# Patient Record
Sex: Female | Born: 1940 | Race: White | Hispanic: No | Marital: Married | Smoking: Never smoker
Health system: Southern US, Community
[De-identification: ages and names within clinical notes are randomized; demographics above are authoritative.]

## PROBLEM LIST (undated history)

## (undated) DIAGNOSIS — F0390 Unspecified dementia without behavioral disturbance: Secondary | ICD-10-CM

## (undated) DIAGNOSIS — R000 Tachycardia, unspecified: Secondary | ICD-10-CM

## (undated) DIAGNOSIS — K219 Gastro-esophageal reflux disease without esophagitis: Secondary | ICD-10-CM

## (undated) DIAGNOSIS — E785 Hyperlipidemia, unspecified: Secondary | ICD-10-CM

## (undated) HISTORY — DX: Hyperlipidemia, unspecified: E78.5

## (undated) HISTORY — DX: Tachycardia, unspecified: R00.0

## (undated) HISTORY — PX: CORONARY ANGIOPLASTY WITH STENT PLACEMENT: SHX49

## (undated) HISTORY — DX: Gastro-esophageal reflux disease without esophagitis: K21.9

---

## 2004-04-12 ENCOUNTER — Emergency Department (HOSPITAL_COMMUNITY): Admission: EM | Admit: 2004-04-12 | Discharge: 2004-04-12 | Payer: Self-pay | Admitting: *Deleted

## 2008-08-29 ENCOUNTER — Ambulatory Visit: Payer: Self-pay | Admitting: Cardiology

## 2008-09-03 ENCOUNTER — Ambulatory Visit (HOSPITAL_COMMUNITY): Admission: RE | Admit: 2008-09-03 | Discharge: 2008-09-03 | Payer: Self-pay | Admitting: Cardiology

## 2008-09-05 ENCOUNTER — Ambulatory Visit: Payer: Self-pay | Admitting: Cardiology

## 2008-09-05 ENCOUNTER — Inpatient Hospital Stay (HOSPITAL_BASED_OUTPATIENT_CLINIC_OR_DEPARTMENT_OTHER): Admission: RE | Admit: 2008-09-05 | Discharge: 2008-09-05 | Payer: Self-pay | Admitting: Cardiology

## 2008-09-13 ENCOUNTER — Inpatient Hospital Stay (HOSPITAL_COMMUNITY): Admission: RE | Admit: 2008-09-13 | Discharge: 2008-09-14 | Payer: Self-pay | Admitting: Cardiology

## 2008-09-13 ENCOUNTER — Ambulatory Visit: Payer: Self-pay | Admitting: Cardiology

## 2008-10-01 ENCOUNTER — Ambulatory Visit: Payer: Self-pay | Admitting: Cardiology

## 2008-12-31 DIAGNOSIS — R079 Chest pain, unspecified: Secondary | ICD-10-CM | POA: Insufficient documentation

## 2008-12-31 DIAGNOSIS — K219 Gastro-esophageal reflux disease without esophagitis: Secondary | ICD-10-CM | POA: Insufficient documentation

## 2008-12-31 DIAGNOSIS — E785 Hyperlipidemia, unspecified: Secondary | ICD-10-CM | POA: Insufficient documentation

## 2009-01-02 ENCOUNTER — Telehealth (INDEPENDENT_AMBULATORY_CARE_PROVIDER_SITE_OTHER): Payer: Self-pay | Admitting: *Deleted

## 2009-01-04 ENCOUNTER — Encounter (INDEPENDENT_AMBULATORY_CARE_PROVIDER_SITE_OTHER): Payer: Self-pay | Admitting: *Deleted

## 2009-01-07 ENCOUNTER — Encounter: Payer: Self-pay | Admitting: Cardiology

## 2009-01-07 DIAGNOSIS — I2510 Atherosclerotic heart disease of native coronary artery without angina pectoris: Secondary | ICD-10-CM | POA: Insufficient documentation

## 2009-01-10 ENCOUNTER — Ambulatory Visit: Payer: Self-pay | Admitting: Cardiology

## 2009-04-02 ENCOUNTER — Encounter (INDEPENDENT_AMBULATORY_CARE_PROVIDER_SITE_OTHER): Payer: Self-pay

## 2009-04-02 LAB — CONVERTED CEMR LAB
ALT: 16 units/L
AST: 18 units/L
Albumin: 4.5 g/dL
Alkaline Phosphatase: 78 units/L
CO2: 27 meq/L
Cholesterol: 235 mg/dL
Creatinine, Ser: 0.91 mg/dL
Glomerular Filtration Rate, Af Am: >60 mL/min/{1.73_m2}
HDL: 4.4 mg/dL
LDL Cholesterol: 139 mg/dL
Sodium: 144 meq/L
Total Protein: 7.2 g/dL

## 2009-04-04 ENCOUNTER — Encounter (INDEPENDENT_AMBULATORY_CARE_PROVIDER_SITE_OTHER): Payer: Self-pay

## 2009-04-08 ENCOUNTER — Ambulatory Visit: Payer: Self-pay | Admitting: Cardiology

## 2009-12-06 ENCOUNTER — Ambulatory Visit: Payer: Self-pay | Admitting: Cardiology

## 2010-03-25 ENCOUNTER — Encounter: Payer: Self-pay | Admitting: Cardiology

## 2010-07-01 NOTE — Assessment & Plan Note (Signed)
Summary: 6 MTH F/U PER CHECKOUT ON 04/08/09/TG   Visit Type:  Follow-up Referring Provider:  Dr. Catalina Pizza Primary Provider:  Dr. Catalina Pizza  CC:  no cardiology complaints.  History of Present Illness: Katrina Gallegos returns today for evaluation and management of her coronary disease. She has a history of a drug eluting stent placed in the right coronary artery in April 2004 for exertional angina.  She is having no angina has not had to use the nitroglycerin. Dr. Margo Aye switched her from simvastatin 82 Crestor 20.    Current Medications (verified): 1)  Klonopin 1 Mg Tabs (Clonazepam) .... Take 1 Tablet By Mouth Two Times A Day 2)  Metoprolol Succinate 50 Mg Xr24h-Tab (Metoprolol Succinate) .... Take One Tablet By Mouth Daily 3)  Plavix 75 Mg Tabs (Clopidogrel Bisulfate) .... Take One Tablet By Mouth Daily 4)  Aspirin 325 Mg Tabs (Aspirin) .... Take 1 Tab Daily 5)  Nitroglycerin 0.4 Mg Subl (Nitroglycerin) .... One Tablet Under Tongue Every 5 Minutes As Needed For Chest Pain---May Repeat Times Three 6)  Crestor 20 Mg Tabs (Rosuvastatin Calcium) .... Take 1 Tab At Bedtime 7)  Tums 500 Mg Chew (Calcium Carbonate Antacid) .... Use As Needed  Allergies (verified): No Known Drug Allergies  Past History:  Past Medical History: Last updated: 01/07/2009 Coronary artery disease: DES for 80-90% RCA lesion in 4/10; normal ejection fraction. SINUS TACHYCARDIA (ICD-427.89) DYSLIPIDEMIA (ICD-272.4) CHEST PAIN, EXERTIONAL (ICD-786.50) GERD (ICD-530.81)  Family History: Last updated: 01/27/2009 Father: deceased CVA Mother: deceased  MI Siblings: 1 sister living- cardiovascular disease                1 sister deceased unknown  Social History: Last updated: 01-27-2009 Married  Tobacco Use - No.  Alcohol Use - no Regular Exercise - no  Risk Factors: Exercise: no (27-Jan-2009)  Risk Factors: Smoking Status: never (Jan 27, 2009)  Review of Systems       negative other than history of  present illness  Vital Signs:  Patient profile:   70 year old female Weight:      146 pounds BMI:     27.69 Pulse rate:   66 / minute BP sitting:   125 / 69  (right arm)  Vitals Entered By: Dreama Saa, CNA (December 06, 2009 2:02 PM)  Physical Exam  General:  Well developed, well nourished, in no acute distress. Head:  normocephalic and atraumatic Eyes:  PERRLA/EOM intact; conjunctiva and lids normal. Neck:  Neck supple, no JVD. No masses, thyromegaly or abnormal cervical nodes. Lungs:  Clear bilaterally to auscultation and percussion. Heart:  Non-displaced PMI, chest non-tender; regular rate and rhythm, S1, S2 without murmurs, rubs or gallops. Carotid upstroke normal, no bruit. Normal abdominal aortic size, no bruits. Femorals normal pulses, no bruits. Pedals normal pulses. No edema, no varicosities. Msk:  Back normal, normal gait. Muscle strength and tone normal. Pulses:  pulses normal in all 4 extremities Extremities:  No clubbing or cyanosis. Neurologic:  Alert and oriented x 3. Skin:  Intact without lesions or rashes. Psych:  Normal affect.   Impression & Recommendations:  Problem # 1:  CAD (ICD-414.00) Assessment Unchanged  Her updated medication list for this problem includes:    Metoprolol Succinate 50 Mg Xr24h-tab (Metoprolol succinate) .Marland Kitchen... Take one tablet by mouth daily    Plavix 75 Mg Tabs (Clopidogrel bisulfate) .Marland Kitchen... Take one tablet by mouth daily    Aspirin 325 Mg Tabs (Aspirin) .Marland Kitchen... Take 1 tab daily    Nitroglycerin 0.4  Mg Subl (Nitroglycerin) ..... One tablet under tongue every 5 minutes as needed for chest pain---may repeat times three  Problem # 2:  DYSLIPIDEMIA (ICD-272.4) Assessment: Unchanged  Her updated medication list for this problem includes:    Crestor 20 Mg Tabs (Rosuvastatin calcium) .Marland Kitchen... Take 1 tab at bedtime  Problem # 3:  CHEST PAIN, EXERTIONAL (ICD-786.50) Assessment: Improved  Her updated medication list for this problem includes:     Metoprolol Succinate 50 Mg Xr24h-tab (Metoprolol succinate) .Marland Kitchen... Take one tablet by mouth daily    Plavix 75 Mg Tabs (Clopidogrel bisulfate) .Marland Kitchen... Take one tablet by mouth daily    Aspirin 325 Mg Tabs (Aspirin) .Marland Kitchen... Take 1 tab daily    Nitroglycerin 0.4 Mg Subl (Nitroglycerin) ..... One tablet under tongue every 5 minutes as needed for chest pain---may repeat times three  Patient Instructions: 1)  Your physician recommends that you schedule a follow-up appointment in: April 2012 2)  Your physician recommends that you continue on your current medications as directed. Please refer to the Current Medication list given to you today.  Prevention & Chronic Care Immunizations   Influenza vaccine: Not documented    Tetanus booster: Not documented    Pneumococcal vaccine: Not documented    H. zoster vaccine: Not documented  Colorectal Screening   Hemoccult: Not documented    Colonoscopy: Not documented  Other Screening   Pap smear: Not documented    Mammogram: Not documented    DXA bone density scan: Not documented   Smoking status: never  (12/31/2008)  Lipids   Total Cholesterol: 235  (04/02/2009)   LDL: 139  (04/02/2009)   LDL Direct: Not documented   HDL: 4.4  (04/02/2009)   Triglycerides: 212  (04/02/2009)    SGOT (AST): 18  (04/02/2009)   SGPT (ALT): 16  (04/02/2009)   Alkaline phosphatase: 78  (04/02/2009)   Total bilirubin: Not documented  Self-Management Support :    Lipid self-management support: Not documented

## 2010-09-10 LAB — BASIC METABOLIC PANEL
BUN: 9 mg/dL (ref 6–23)
CO2: 29 mEq/L (ref 19–32)
CO2: 30 mEq/L (ref 19–32)
Chloride: 104 mEq/L (ref 96–112)
Creatinine, Ser: 0.77 mg/dL (ref 0.4–1.2)
GFR calc non Af Amer: >60 mL/min (ref >60)
Glucose, Bld: 96 mg/dL (ref 70–99)
Glucose, Bld: 99 mg/dL (ref 70–99)
Potassium: 3.8 mEq/L (ref 3.5–5.1)
Potassium: 4.3 mEq/L (ref 3.5–5.1)
Sodium: 144 mEq/L (ref 135–145)

## 2010-09-10 LAB — APTT: aPTT: 28 seconds (ref 24–37)

## 2010-09-10 LAB — CBC
HCT: 34.7 % — ABNORMAL LOW (ref 36.0–46.0)
Hemoglobin: 12 g/dL (ref 12.0–15.0)
MCHC: 34.6 g/dL (ref 30.0–36.0)
RDW: 13.9 % (ref 11.5–15.5)

## 2010-10-03 ENCOUNTER — Encounter: Payer: Self-pay | Admitting: Cardiology

## 2010-10-03 ENCOUNTER — Ambulatory Visit (INDEPENDENT_AMBULATORY_CARE_PROVIDER_SITE_OTHER): Payer: Medicare Other | Admitting: Cardiology

## 2010-10-03 VITALS — BP 138/86 | HR 80 | Ht 61.0 in | Wt 151.0 lb

## 2010-10-03 DIAGNOSIS — I2510 Atherosclerotic heart disease of native coronary artery without angina pectoris: Secondary | ICD-10-CM

## 2010-10-03 NOTE — Assessment & Plan Note (Signed)
Stable, no change in treatment.

## 2010-10-03 NOTE — Progress Notes (Signed)
   Patient ID: Katrina Gallegos, female    DOB: [birth year], 70 y.o.   MRN: [id number]  HPI Katrina Gallegos returns today for E and M of her CAD. She is doing well without any complaint of angina or chest pain.  Primary Care follows her blood work. She is very compliant.    Review of Systems  All other systems reviewed and are negative.      Physical Exam  Constitutional: She appears well-nourished. No distress.  HENT:  Head: Normocephalic and atraumatic.  Eyes: EOM are normal. Pupils are equal, round, and reactive to light.  Neck: Neck supple. No JVD present. No tracheal deviation present. No thyromegaly present.  Cardiovascular: Normal rate, regular rhythm, normal heart sounds and intact distal pulses.   No murmur heard.      No carotid bruits  Pulmonary/Chest: Effort normal and breath sounds normal.  Abdominal: Soft. Bowel sounds are normal.  Musculoskeletal: She exhibits no edema.  Neurological: She is alert.  Skin: Skin is warm and dry.  Psychiatric: She has a normal mood and affect.

## 2010-10-03 NOTE — Patient Instructions (Signed)
**Note De-identified  Obfuscation** Your physician recommends that you continue on your current medications as directed. Please refer to the Current Medication list given to you today.  Your physician recommends that you schedule a follow-up appointment in: 1 year  

## 2010-10-14 NOTE — Assessment & Plan Note (Signed)
[facility]                       [place] CARDIOLOGY OFFICE NOTE   EDYNN, GILLOCK                        MRN:          [id number]  DATE:10/01/2008                            DOB:          1941-01-21    CARDIOLOGIST:  Jesse Sans. Daleen Squibb, MD, [facility]   PRIMARY CARE PHYSICIAN:  Catalina Pizza, MD   REASON FOR VISIT:  Post-hospitalization followup.   HISTORY OF PRESENT ILLNESS:  Katrina Gallegos is a 70 year old female patient  who recently saw Dr. Daleen Squibb in the office with chest pain concerning for  exertional angina pectoris.  She was set up for an outpatient cardiac  catheterization that demonstrated single vessel CAD with an 80-90%  ostial RCA stenosis.  She was brought back for intervention on September 13, 2008, with Dr. Juanda Chance.  She had a Xience drug-eluting stent placed to  the ostial RCA.  Her ejection fraction was well preserved.  She was  discharged from [facility] and follows up in the office today.  Unfortunately,  the patient stopped taking her aspirin for unclear reasons.  She does  continue on Plavix.  She has not had any recurrence of chest pain.  She  is complaining of difficulty with sleeping.  She seems to be under some  stress at home.  She denies any significant shortness of breath,  orthopnea, PND, or pedal edema.  She denies any syncope.  She has been  trying to walk on her treadmill.  She is doing well with that.   CURRENT MEDICATIONS:  1. Clonazepam 1 mg 3 times a day.  2. Simvastatin 40 mg nightly.  3. Ranitidine 150 mg daily.  4. Metoprolol succinate 50 mg daily.  5. Plavix 75 mg daily.  6. Tums p.r.n.  7. Aspirin p.r.n.   ALLERGIES:  No known drug allergies.   PHYSICAL EXAMINATION:  GENERAL:  She is a well-nourished, well-developed  female in no acute distress.  VITAL SIGNS:  Blood pressure 131/72, pulse 67, weight 131 pounds.  HEENT:  Normal neck without JVD.  CARDIAC:  Normal S1 and S2.  Regular rate and rhythm.  LUNGS:  Clear to  auscultation bilaterally.  ABDOMEN:  Soft, nontender.  EXTREMITIES:  Without edema.  NEUROLOGIC:  She is alert and oriented x3.  Cranial nerves II-XII  grossly intact.  VASCULAR:  Right femoral arteriotomy site without hematoma or bruit.   Electrocardiogram reveals sinus rhythm with a heart rate of 57, normal  axis, no acute changes.   ASSESSMENT AND PLAN:  1. Coronary disease status post Xience drug-eluting stent to the      ostial right coronary artery with overall preserved left      ventricular function.  She is doing well from a cardiovascular      standpoint.  She denies any further angina.  She is taking her      Plavix but unfortunately stopped her aspirin for unclear reasons.      I have explained to her the importance of taking aspirin and Plavix      together.  She should restart aspirin 325 mg a day today.  2.  Hypertension.  This is much better controlled on metoprolol      succinate 50 mg a day.  However, her pressure could be better.  She      is reporting a lot of fatigue that is probably related to insomnia.      We will hold off on any medication changes at this point until she      has her insomnia under control.  3. Insomnia.  I have asked her to start taking one of her clonazepam      at bedtime to see if this helps with sleep.  She sees Dr. Margo Aye in      several days.  If she receives no relief from this, she may be a      candidate for other therapies such as Ambien.  I will leave this up      to Dr. Margo Aye.  4. Dyslipidemia.  She had labs drawn by Dr. Margo Aye back in February that      demonstrated an LDL of 114.  I believe her simvastatin was      increased at that time.  Her goal LDL is less than or equal to 70      at this time.  She will continue followup with Dr. Margo Aye.  5. Dyspepsia.  The patient really denies any significant symptoms of      dyspepsia.  She was started on omeprazole initially when she began      having chest pain.  She was told to stop taking  omeprazole in the      hospital.  She has been given ranitidine to take on a p.r.n. basis.      I have explained to her that she can take this or TUMS as needed.   DISPOSITION:  She will be brought back in followup with Dr. Daleen Squibb in the  next 3 months or sooner p.r.n.      Tereso Newcomer, PA-C  Electronically Signed      Gerrit Friends. Dietrich Pates, MD, [facility]  Electronically Signed   SW/MedQ  DD: 10/01/2008  DT: 10/02/2008  Job #: [id number]   cc:   Catalina Pizza, M.D.

## 2010-10-14 NOTE — Discharge Summary (Signed)
Katrina Gallegos, Katrina Gallegos                 ACCOUNT NO.:  [id number]   MEDICAL RECORD NO.:  [id number]          PATIENT TYPE:  INP   LOCATION:  2507                         FACILITY:  [facility]   PHYSICIAN:  Everardo Beals. Juanda Chance, MD, FACCDATE OF BIRTH:  [birth year]   DATE OF ADMISSION:  09/13/2008  DATE OF DISCHARGE:  09/14/2008                               DISCHARGE SUMMARY   DISCHARGE DIAGNOSIS:  Angina/coronary artery disease status post cardiac  catheterization this admission by Dr. Charlies Constable on September 13, 2008.  The patient underwent successful percutaneous coronary intervention of  the ostial lesion in the right coronary artery using a Xience drug-  eluting stent with improvement in diameter narrowing from 95 to less  than 10%.   PAST MEDICAL HISTORY:  1. GERD.  2. Exertional chest discomfort.  3. Dyslipidemia.  4. Sinus tachycardia prompting increase in beta-blocker from 25 to 50      mg this admission.   HOSPITAL COURSE:  [name] is a 70 year old female followed by Dr.  Valera Castle in [place] and Dr. Catalina Pizza.  The patient was seen in  consultation by Dr. Daleen Squibb back in March for chest discomfort concerning  for angina.  The patient was scheduled for cardiac catheterization which  she underwent on April 7, was found to have an ejection fraction of 60%  and coronary artery disease with 80-90% stenosis in the ostium of the  RCA.  She had no significant obstruction in the LAD and circumflex  arteries.  The patient was scheduled for a planned PCI on April 15.  The  patient underwent procedure as stated above, tolerated without  complications.  The patient noted to remain mildly tachycardic by  cardiac rehabilitation staff with ambulating.  Note, the patient was  also anxious.  She had not been able to get in touch with her husband,  but her heart rate was 110-113 in room prior to walking and then  increased to 130 in hallway while walking.  Blood pressures were 140/95  and 123/87.   Thus the patient's beta-blocker was increased to 50 mg  daily.  The patient was seen by Dr. Juanda Chance on day of discharge.  Cath  site stable.  Potassium 3.8, creatinine 0.77, and hemoglobin 11.5.  The  patient stable to be discharged home.  She has already been started on  Plavix 75 mg daily.  Her Toprol was increased to 50 mg daily,  prescription provided.  Nitroglycerin p.r.n., prescription provided.  We  have instructed her to stop Prilosec and use Zantac 150 mg b.i.d.  She  can continue her  simvastatin 40 mg daily and aspirin 325.  She has been given the post  cardiac catheterization discharge instructions.  Follow up with Dr. Daleen Squibb  at the [facility] office on April 28 at 12 noon.   Duration of discharge encounter less than 30 minutes.      Dorian Pod, ACNP      Bruce R. Juanda Chance, MD, [facility]  Electronically Signed    MB/MEDQ  D:  09/14/2008  T:  09/15/2008  Job:  [id number]  cc:   Catalina Pizza, M.D.

## 2010-10-14 NOTE — Cardiovascular Report (Signed)
NAMEREEDA, SOOHOO                 ACCOUNT NO.:  [id number]   MEDICAL RECORD NO.:  [id number]          PATIENT TYPE:  INP   LOCATION:  2507                         FACILITY:  [facility]   PHYSICIAN:  Everardo Beals. Juanda Chance, MD, FACCDATE OF BIRTH:  1940-08-21   DATE OF PROCEDURE:  09/13/2008  DATE OF DISCHARGE:                            CARDIAC CATHETERIZATION   CLINICAL HISTORY:  Ms. Forton is 70 years old and recently developed  exertional chest pain.  She was sent by Dr. Margo Aye to Dr. Daleen Squibb who  arranged for her to have evaluation with angiography.  This was done  last week in the [facility] lab and she was found to have a 95% ostial stenosis  in the right coronary artery and we brought her back today for  intervention.   PROCEDURE:  The procedure was performed by right femoral artery and  arterial sheath and 6-French left bypass graft guiding catheter with  side holes.  The patient was given Angiomax bolus infusion and had been  previously loaded with Plavix and had been given 4 chewable aspirin.  We  passed a PT2 light-support wire across the ostial lesion without too  much difficulty.  We then predilated the lesion with a 2.25 x 15-mm apex  balloon performing several inflations up to 12 atmospheres for 30  seconds.  We then deployed a 2.75 x 12-mm Xience stent.  We positioned  this about 2 mm outside the ostium and deployed this with 1 inflation of  11 atmospheres of 30 seconds and second inflation of 16 atmospheres for  30 seconds with the balloon inside the distal edge.  We then postdilated  the lesion with a 3.25 x 12-mm Butler Voyager and deployed this with 1  inflation up to 18 atmospheres for 30 seconds.  Final diagnostics were  the performed through guiding catheter.  The patient tolerated the  procedure well and left laboratory in satisfactory condition.  Right  femoral artery was closed with Angio-Seal at the end of the procedure.   RESULTS:  Initially, stenosis in the ostium of the right  coronary artery  was estimated at 95%.  Following stenting, this improved to less than  10%.   CONCLUSION:  Successful PCI of the ostial lesion in the right coronary  using a Xience drug-eluting stent with improvement in diameter narrowing  from 95% to less than 10%.   DISPOSITION:  The patient returned to post angio unit for further  observation.      Bruce Elvera Lennox Juanda Chance, MD, [facility]  Electronically Signed     BRB/MEDQ  D:  09/13/2008  T:  09/14/2008  Job:  [id number]   cc:   Thomas C. Daleen Squibb, MD, [facility]  Catalina Pizza, M.D.  Cardiopulmonary Lab

## 2010-10-14 NOTE — Assessment & Plan Note (Signed)
[facility]                       [place] CARDIOLOGY OFFICE NOTE   ADDI, PAK                        MRN:          [id number]  DATE:08/29/2008                            DOB:          May 07, 1941    CHIEF COMPLAINT:  I think I have angina.   HISTORY OF PRESENT ILLNESS:  I was asked by Dr. Dwana Melena to consult on  [place] C. Bossier concerning chest tightness in the center of her chest  with exercise.   This has been going on for several months.  It is not associated with  any other symptoms other than some mild shortness of breath.  Specifically, no nausea, vomiting, diaphoresis.  Easily goes away after  she stops exercising.   Some of this is associated with bending over or after eating.  Some of  this sounds like reflux and actually some of the discomfort but not the  exertion related.  Symptoms gotten better with omeprazole.   Her cardiac risk factors include age, hyperlipidemia.  She also has a  strikingly positive family history particularly in her sisters, one who  died at age 48 of heart attack and one who is living with heart disease  at 77.  Her father also died of a stroke.  Her mother died of an MI.   PAST MEDICAL HISTORY:  She has had no surgeries other than a  colonoscopy.  She has no known drug allergies.  She does not smoke or  drink.   CURRENT MEDICATIONS:  1. B12 one p.o. daily.  2. Omeprazole 40 mg a day.  3. Clonazepam 1 mg p.o. t.i.d.  4. Simvastatin 40 mg at bedtime.  5. Tums p.r.n.  6. Aspirin 81 mg p.r.n. for headache.   SOCIAL HISTORY:  She lives in [place].  She is a Futures trader.  She is  married, has 1 child and 5 grandchildren.  She enjoys walking on a  treadmill.   FAMILY HISTORY:  As mentioned above.   REVIEW OF SYSTEMS:  She wears glasses.  She has dentures on the upper  side not on the lower.  She has had a history of palpitations in the  past which is currently not active.  She has a history of  gastroesophageal reflux as outlined above.  Her other review of systems  are negative.  All questioned on her diagnostic evaluation form.   PHYSICAL EXAMINATION:  VITAL SIGNS:  Her blood pressure is 142/84 in the  right arm, her pulse is 73 and regular.  She is 5 feet tall, weighs 140  pounds.  GENERAL:  She is very pleasant.  She is very anxious.  Alert and  oriented x3.  SKIN:  Warm and dry.  HEENT:  Normal otherwise other than the dentures.  NECK:  Supple.  Carotid upstrokes were equal bilaterally without bruits.  Thyroid is not enlarged.  Trachea is midline.  There is no  lymphadenopathy.  CHEST:  Lungs are clear to auscultation and percussion.  HEART:  Nondisplaced PMI, normal S1 and S2.  ABDOMEN:  Soft, good bowel sounds.  No midline bruit.  No  pulsatile  mass.  No hepatomegaly.  EXTREMITIES:  No cyanosis, clubbing, or edema.  Pulses were present and  equal bilaterally.  There is no sign of DVT.  SKIN:  Unremarkable.  MUSCULOSKELETAL:  Some chronic arthritic changes.   Her electrocardiogram is normal.   ASSESSMENT:  Exertional chest discomfort consistent with angina.  Some  of this may be reflux and some of it is particularly positional  discomfort, has gotten better with omeprazole.  She does have several  cardiac risk factors including age, very strong family history, and  hyperlipidemia.  However, note that her HDL was fairly high.   RECOMMENDATIONS:  1. Begin aspirin 81 mg p.o. daily.  2. Continue other medications.  3. Outpatient cardiac catheterization.  Indications, risks, potential      benefits have been discussed.  All necessary paperwork, consent      included, as well as a chest x-ray and precath blood work have been      ordered and will be reviewed.     Thomas C. Daleen Squibb, MD, [facility]  Electronically Signed    TCW/MedQ  DD: 08/29/2008  DT: 08/29/2008  Job #: [id number]   cc:   Catalina Pizza, M.D.

## 2010-10-14 NOTE — Cardiovascular Report (Signed)
Katrina Gallegos, Katrina Gallegos                 ACCOUNT NO.:  [id number]   MEDICAL RECORD NO.:  [id number]          PATIENT TYPE:  OIB   LOCATION:  1999                         FACILITY:  [facility]   PHYSICIAN:  Bruce R. Juanda Chance, MD, FACCDATE OF BIRTH:  [birth year]   DATE OF PROCEDURE:  09/05/2008  DATE OF DISCHARGE:  09/05/2008                            CARDIAC CATHETERIZATION   CLINICAL HISTORY:  Katrina Gallegos is 70 year old and has a strong family  history of coronary artery disease with a sister who died at 71 of a  heart attack and another sister who has coronary artery disease at age  42 and a mother who died of MI.  She recently has developed chest pain  with exertion and was seen in consultation by Dr. Daleen Squibb who recommended  evaluation with angiography.   PROCEDURE:  The procedure was performed by the right femoral arterial  sheath and 4-French preformed coronary catheters.  A front wall arterial  puncture with a following Omnipaque contrast was used.  We had  difficulty entering the right coronary artery and the 4-French catheter  damped.  We were able to get a subselective injection with the 4-French  catheter.  The patient tolerated the procedure well and left the  laboratory in satisfactory condition.   RESULTS:  Left main coronary artery.  The left main coronary artery is  free of significant disease.   Left anterior descending artery.  The left anterior descending artery  gave rise to three septal perforators and two diagonal branches.  These  and  __________ were free of significant disease.   The circumflex artery:  The circumflex artery gave rise to a ramus  branch, a marginal branch, and a small posterolateral branch.  These  vessels were free of significant disease.   The right coronary artery:  The right coronary artery was a moderate-  sized vessel that gave rise to posterior descending branch and three  posterolateral branches.  The vessel also gave rise to a right  ventricular  branch.  There was an 80-90% stenosis at the ostium of the  right coronary artery.  We could see some collateral filling from the  left coronary artery, which appeared to be right ventricular branches.   The left ventriculogram:  The left ventriculogram performed in the RAO  projection showed good wall motion with no areas of hypokinesis.  The  estimated ejection fraction was 60%.   The left ventricular pressure was 154/28 and __________ pressure was  154/86 with a mean of 116.   CONCLUSION:  Coronary artery disease with 80-90% stenosis in the ostium  of the right coronary artery, no significant obstruction in the LAD and  circumflex arteries and normal LV function.   RECOMMENDATIONS:  The patient has a tight what appears to be flow-  limiting lesion in the ostium of the right coronary artery.  We will  plan to schedule the patient for to return for percutaneous coronary  intervention on Tuesday April 13.  In the meantime, we will start Plavix  and continue aspirin and start the Toprol-XL 25 mg.  Bruce Elvera Lennox Juanda Chance, MD, [facility]  Electronically Signed     BRB/MEDQ  D:  09/05/2008  T:  09/06/2008  Job:  [id number]   cc:   Catalina Pizza, M.D.  Thomas C. Wall, MD, [facility]

## 2011-10-12 ENCOUNTER — Encounter: Payer: Self-pay | Admitting: Cardiology

## 2011-10-12 ENCOUNTER — Ambulatory Visit (INDEPENDENT_AMBULATORY_CARE_PROVIDER_SITE_OTHER): Payer: PRIVATE HEALTH INSURANCE | Admitting: Cardiology

## 2011-10-12 VITALS — BP 158/86 | HR 87 | Resp 16 | Ht 61.0 in | Wt 151.0 lb

## 2011-10-12 DIAGNOSIS — E785 Hyperlipidemia, unspecified: Secondary | ICD-10-CM

## 2011-10-12 DIAGNOSIS — I498 Other specified cardiac arrhythmias: Secondary | ICD-10-CM

## 2011-10-12 DIAGNOSIS — I2510 Atherosclerotic heart disease of native coronary artery without angina pectoris: Secondary | ICD-10-CM

## 2011-10-12 DIAGNOSIS — R000 Tachycardia, unspecified: Secondary | ICD-10-CM

## 2011-10-12 NOTE — Patient Instructions (Signed)
Your physician recommends that you continue on your current medications as directed. Please refer to the Current Medication list given to you today.  Your physician wants you to follow-up in: 1 year. You will receive a reminder letter in the mail two months in advance. If you don't receive a letter, please call our office to schedule the follow-up appointment.  

## 2011-10-12 NOTE — Progress Notes (Signed)
HPI Mr Burmaster comes in today for the evaluation and management of her history of chest pain, sinus tachycardia, and coronary artery disease.  She's having no symptoms of angina or ischemia. Prior arriving to the office, her husband and her were an argument. Her blood pressure was elevated in the 160 systolic. She has not had a history of hypertension.  Meds reviewed and she is compliant. She is on a statin, metoprolol, aspirin and nitroglycerin when necessary.  Past Medical History  Diagnosis Date  . Sinus tachycardia   . Dyslipidemia   . Chest pain   . GERD (gastroesophageal reflux disease)     Current Outpatient Prescriptions  Medication Sig Dispense Refill  . aspirin 81 MG tablet Take 81 mg by mouth as needed.       Marland Kitchen atorvastatin (LIPITOR) 20 MG tablet Take 20 mg by mouth daily.      . calcium carbonate (TUMS - DOSED IN MG ELEMENTAL CALCIUM) 500 MG chewable tablet Chew 1 tablet by mouth daily.        . clonazePAM (KLONOPIN) 1 MG tablet Take 1 mg by mouth 2 (two) times daily as needed.        . metoprolol (TOPROL-XL) 50 MG 24 hr tablet Take 50 mg by mouth daily.        . nitroGLYCERIN (NITROSTAT) 0.4 MG SL tablet Place 0.4 mg under the tongue every 5 (five) minutes as needed.          No Known Allergies  No family history on file.  History   Social History  . Marital Status: Married    Spouse Name: N/A    Number of Children: N/A  . Years of Education: N/A   Occupational History  . Not on file.   Social History Main Topics  . Smoking status: Never Smoker   . Smokeless tobacco: Never Used  . Alcohol Use: No  . Drug Use: No  . Sexually Active: Not on file   Other Topics Concern  . Not on file   Social History Narrative  . No narrative on file    ROS ALL NEGATIVE EXCEPT THOSE NOTED IN HPI  PE  General Appearance: well developed, well nourished in no acute distress, obese HEENT: symmetrical face, PERRLA, good dentition  Neck: no JVD, thyromegaly, or  adenopathy, trachea midline Chest: symmetric without deformity Cardiac: PMI non-displaced, RRR, normal S1, S2, no gallop or murmur Lung: clear to ausculation and percussion Vascular: all pulses full without bruits  Abdominal: nondistended, nontender, good bowel sounds, no HSM, no bruits Extremities: no cyanosis, clubbing or edema, no sign of DVT, no varicosities  Skin: normal color, no rashes Neuro: alert and oriented x 3, non-focal Pysch: normal affect I rechecked her blood pressure and it was 142/88. EKG Normal sinus rhythm, normal EKG BMET    Component Value Date/Time   NA 144 04/02/2009   K 4.8 04/02/2009   CL 106 04/02/2009   CO2 27 04/02/2009   GLUCOSE 88 04/02/2009   BUN 14 04/02/2009   CREATININE 0.91 04/02/2009   CALCIUM 9.8 04/02/2009   GFRNONAA >60 04/02/2009   GFRAA  Value: >60        The eGFR has been calculated using the MDRD equation. This calculation has not been validated in all clinical situations. eGFR's persistently <60 mL/min signify possible Chronic Kidney Disease. 09/14/2008 0630    Lipid Panel     Component Value Date/Time   CHOL 235 04/02/2009   TRIG 212 04/02/2009  HDL 4.4 04/02/2009   LDLCALC 139 04/02/2009    CBC    Component Value Date/Time   WBC 6.5 09/14/2008 0630   RBC 4.08 09/14/2008 0630   HGB 11.5* 09/14/2008 0630   HCT 33.2* 09/14/2008 0630   PLT 222 09/14/2008 0630   MCV 81.4 09/14/2008 0630   MCHC 34.6 09/14/2008 0630   RDW 14.1 09/14/2008 0630

## 2011-10-12 NOTE — Assessment & Plan Note (Signed)
Stable. Continue secondary preventative therapy. I've asked her to monitor her blood pressure at rest and are not upset. Goal of less than 140/90 given. Will see back in the office in a year.

## 2012-01-07 ENCOUNTER — Encounter: Payer: Self-pay | Admitting: Cardiology

## 2013-06-20 ENCOUNTER — Encounter: Payer: Self-pay | Admitting: Cardiology

## 2013-06-20 ENCOUNTER — Ambulatory Visit (INDEPENDENT_AMBULATORY_CARE_PROVIDER_SITE_OTHER): Payer: Private Health Insurance - Indemnity | Admitting: Cardiology

## 2013-06-20 VITALS — BP 119/62 | HR 88 | Ht 61.0 in | Wt 157.0 lb

## 2013-06-20 DIAGNOSIS — E785 Hyperlipidemia, unspecified: Secondary | ICD-10-CM

## 2013-06-20 DIAGNOSIS — I25810 Atherosclerosis of coronary artery bypass graft(s) without angina pectoris: Secondary | ICD-10-CM

## 2013-06-20 NOTE — Progress Notes (Addendum)
Clinical Summary Katrina Gallegos is a 73 y.o.female former patient of Dr Daleen Squibb, this is our first visit together. She is seen for the following medical problems.  1. CAD - prior DES to RCA in 08/2008, LVEF 60% by LV gram at that time - no recent chest pain. No SOB, no DOE. She walks on treadmill 15 minutes a day without issues - compliant with meds, she is not taking ASA, not sure she was supposed to.  2. Hyperlipidemia - followed by Dr Margo Aye, had recent labs last week.     Past Medical History  Diagnosis Date  . Sinus tachycardia   . Dyslipidemia   . Chest pain   . GERD (gastroesophageal reflux disease)      No Known Allergies   Current Outpatient Prescriptions  Medication Sig Dispense Refill  . aspirin 81 MG tablet Take 81 mg by mouth as needed.       Marland Kitchen atorvastatin (LIPITOR) 20 MG tablet Take 20 mg by mouth daily.      . calcium carbonate (TUMS - DOSED IN MG ELEMENTAL CALCIUM) 500 MG chewable tablet Chew 1 tablet by mouth daily.        . clonazePAM (KLONOPIN) 1 MG tablet Take 1 mg by mouth 2 (two) times daily as needed.        . metoprolol (TOPROL-XL) 50 MG 24 hr tablet Take 50 mg by mouth daily.        . nitroGLYCERIN (NITROSTAT) 0.4 MG SL tablet Place 0.4 mg under the tongue every 5 (five) minutes as needed.         No current facility-administered medications for this visit.     No past surgical history on file.   No Known Allergies    No family history on file.   Social History Katrina Gallegos reports that she has never smoked. She has never used smokeless tobacco. Katrina Gallegos reports that she does not drink alcohol.   Review of Systems CONSTITUTIONAL: No weight loss, fever, chills, weakness or fatigue.  HEENT: Eyes: No visual loss, blurred vision, double vision or yellow sclerae.No hearing loss, sneezing, congestion, runny nose or sore throat.  SKIN: No rash or itching.  CARDIOVASCULAR: per HPI RESPIRATORY: No shortness of breath, cough or sputum.    GASTROINTESTINAL: No anorexia, nausea, vomiting or diarrhea. No abdominal pain or blood.  GENITOURINARY: No burning on urination, no polyuria NEUROLOGICAL: No headache, dizziness, syncope, paralysis, ataxia, numbness or tingling in the extremities. No change in bowel or bladder control.  MUSCULOSKELETAL: No muscle, back pain, joint pain or stiffness.  LYMPHATICS: No enlarged nodes. No history of splenectomy.  PSYCHIATRIC: No history of depression or anxiety.  ENDOCRINOLOGIC: No reports of sweating, cold or heat intolerance. No polyuria or polydipsia.  Marland Kitchen   Physical Examination p 88 bp 119/62 Wt 157 lbs BMI 30 Gen: resting comfortably, no acute distress HEENT: no scleral icterus, pupils equal round and reactive, no palptable cervical adenopathy,  CV: RRR, no m/r/g, no JVD, no carotid bruits Resp: Clear to auscultation bilaterally GI: abdomen is soft, non-tender, non-distended, normal bowel sounds, no hepatosplenomegaly MSK: extremities are warm, no edema.  Skin: warm, no rash Neuro:  no focal deficits Psych: appropriate affect   Diagnostic Studies 08/2008 Cath  RESULTS: Left main coronary artery. The left main coronary artery is  free of significant disease.  Left anterior descending artery. The left anterior descending artery  gave rise to three septal perforators and two diagonal branches. These  and __________  were free of significant disease.  The circumflex artery: The circumflex artery gave rise to a ramus  branch, a marginal branch, and a small posterolateral branch. These  vessels were free of significant disease.  The right coronary artery: The right coronary artery was a moderate-  sized vessel that gave rise to posterior descending branch and three  posterolateral branches. The vessel also gave rise to a right  ventricular branch. There was an 80-90% stenosis at the ostium of the  right coronary artery. We could see some collateral filling from the  left coronary  artery, which appeared to be right ventricular branches.  The left ventriculogram: The left ventriculogram performed in the RAO  projection showed good wall motion with no areas of hypokinesis. The  estimated ejection fraction was 60%.  The left ventricular pressure was 154/28 and __________ pressure was  154/86 with a mean of 116.  CONCLUSION: Coronary artery disease with 80-90% stenosis in the ostium  of the right coronary artery, no significant obstruction in the LAD and  circumflex arteries and normal LV function.  RECOMMENDATIONS: The patient has a tight what appears to be flow-  limiting lesion in the ostium of the right coronary artery. We will  plan to schedule the patient for to return for percutaneous coronary  intervention on Tuesday April 13. In the meantime, we will start Plavix  and continue aspirin and start the Toprol-XL 25 mg.   RESULTS: Initially, stenosis in the ostium of the right coronary artery  was estimated at 95%. Following stenting, this improved to less than  10%.  CONCLUSION: Successful PCI of the ostial lesion in the right coronary  using a Xience drug-eluting stent with improvement in diameter narrowing  from 95% to less than 10%.    06/20/13 Clinic EKG Normal sinus rhythm   Assessment and Plan  1. CAD - no current symptoms - continue risk factor modification and secondary prevention. Start ASA 81mg  daily.  2. Hyperlipidemia - will request most recent lipids from Dr Margo AyeHall, continue current statin   Follow up 1 year      Antoine PocheJonathan F. Branch, M.D., F.A.C.C.

## 2013-06-20 NOTE — Patient Instructions (Signed)
Your physician wants you to follow-up in: ONE YEAR You will receive a reminder letter in the mail two months in advance. If you don't receive a letter, please call our office to schedule the follow-up appointment.  Your physician has recommended you make the following change in your medication:   1) START TAKING ASPIRIN 81MG ONCE DAILY 

## 2013-06-23 ENCOUNTER — Encounter: Payer: Self-pay | Admitting: Cardiology

## 2014-06-21 ENCOUNTER — Ambulatory Visit: Payer: Private Health Insurance - Indemnity | Admitting: Cardiology

## 2014-09-07 ENCOUNTER — Ambulatory Visit (INDEPENDENT_AMBULATORY_CARE_PROVIDER_SITE_OTHER): Payer: Private Health Insurance - Indemnity | Admitting: Cardiology

## 2014-09-07 ENCOUNTER — Encounter: Payer: Self-pay | Admitting: Cardiology

## 2014-09-07 VITALS — BP 126/82 | HR 102 | Ht 61.0 in | Wt 155.0 lb

## 2014-09-07 DIAGNOSIS — I25709 Atherosclerosis of coronary artery bypass graft(s), unspecified, with unspecified angina pectoris: Secondary | ICD-10-CM

## 2014-09-07 DIAGNOSIS — E785 Hyperlipidemia, unspecified: Secondary | ICD-10-CM | POA: Diagnosis not present

## 2014-09-07 DIAGNOSIS — I2510 Atherosclerotic heart disease of native coronary artery without angina pectoris: Secondary | ICD-10-CM | POA: Diagnosis not present

## 2014-09-07 MED ORDER — ATORVASTATIN CALCIUM 80 MG PO TABS: 80.0000 mg | ORAL_TABLET | Freq: Every day | ORAL | Status: DC

## 2014-09-07 MED ORDER — LISINOPRIL 2.5 MG PO TABS: 2.5000 mg | ORAL_TABLET | Freq: Every day | ORAL | Status: DC

## 2014-09-07 NOTE — Progress Notes (Signed)
Clinical Summary Ms. Katrina Gallegos is a 74 y.o.female seen today for follow up of the following medical problems.   1. CAD - prior DES to RCA in 08/2008, LVEF 60% by LV gram at that time - denies any chest pain, no SOB - compliant with meds  2. Hyperlipidemia - no recent panel in our system  Past Medical History  Diagnosis Date  . Sinus tachycardia   . Dyslipidemia   . Chest pain   . GERD (gastroesophageal reflux disease)      No Known Allergies   Current Outpatient Prescriptions  Medication Sig Dispense Refill  . atorvastatin (LIPITOR) 20 MG tablet Take 40 mg by mouth daily.     . calcium carbonate (TUMS - DOSED IN MG ELEMENTAL CALCIUM) 500 MG chewable tablet Chew 1 tablet by mouth daily.      Marland Kitchen. ezetimibe (ZETIA) 10 MG tablet Take 10 mg by mouth daily.    . fenofibrate (TRICOR) 145 MG tablet Take 145 mg by mouth daily.    . metoprolol (TOPROL-XL) 50 MG 24 hr tablet Take 50 mg by mouth daily.      . nitroGLYCERIN (NITROSTAT) 0.4 MG SL tablet Place 0.4 mg under the tongue every 5 (five) minutes as needed.       No current facility-administered medications for this visit.     No past surgical history on file.   No Known Allergies    No family history on file.   Social History Ms. Shimamoto reports that she has never smoked. She has never used smokeless tobacco. Ms. Katrina Gallegos reports that she does not drink alcohol.   Review of Systems CONSTITUTIONAL: No weight loss, fever, chills, weakness or fatigue.  HEENT: Eyes: No visual loss, blurred vision, double vision or yellow sclerae.No hearing loss, sneezing, congestion, runny nose or sore throat.  SKIN: No rash or itching.  CARDIOVASCULAR: per HPI RESPIRATORY: No shortness of breath, cough or sputum.  GASTROINTESTINAL: No anorexia, nausea, vomiting or diarrhea. No abdominal pain or blood.  GENITOURINARY: No burning on urination, no polyuria NEUROLOGICAL: No headache, dizziness, syncope, paralysis, ataxia, numbness or  tingling in the extremities. No change in bowel or bladder control.  MUSCULOSKELETAL: No muscle, back pain, joint pain or stiffness.  LYMPHATICS: No enlarged nodes. No history of splenectomy.  PSYCHIATRIC: No history of depression or anxiety.  ENDOCRINOLOGIC: No reports of sweating, cold or heat intolerance. No polyuria or polydipsia.  Marland Kitchen.   Physical Examination p 91 bp 126/82 Wt 155 lbs BMI 29 Gen: resting comfortably, no acute distress HEENT: no scleral icterus, pupils equal round and reactive, no palptable cervical adenopathy,  CV: RRR, no m/r/g, no JVD, no carotid bruits Resp: Clear to auscultation bilaterally GI: abdomen is soft, non-tender, non-distended, normal bowel sounds, no hepatosplenomegaly MSK: extremities are warm, no edema.  Skin: warm, no rash Neuro:  no focal deficits Psych: appropriate affect   Diagnostic Studies  08/2008 Cath  RESULTS: Left main coronary artery. The left main coronary artery is  free of significant disease.  Left anterior descending artery. The left anterior descending artery  gave rise to three septal perforators and two diagonal branches. These  and __________ were free of significant disease.  The circumflex artery: The circumflex artery gave rise to a ramus  Katrina Gallegos, a marginal Katrina Gallegos, and a small posterolateral Katrina Gallegos. These  vessels were free of significant disease.  The right coronary artery: The right coronary artery was a moderate-  sized vessel that gave rise to posterior descending  Katrina Gallegos and three  posterolateral branches. The vessel also gave rise to a right  ventricular Katrina Gallegos. There was an 80-90% stenosis at the ostium of the  right coronary artery. We could see some collateral filling from the  left coronary artery, which appeared to be right ventricular branches.  The left ventriculogram: The left ventriculogram performed in the RAO  projection showed good wall motion with no areas of hypokinesis. The  estimated ejection  fraction was 60%.  The left ventricular pressure was 154/28 and __________ pressure was  154/86 with a mean of 116.  CONCLUSION: Coronary artery disease with 80-90% stenosis in the ostium  of the right coronary artery, no significant obstruction in the LAD and  circumflex arteries and normal LV function.  RECOMMENDATIONS: The patient has a tight what appears to be flow-  limiting lesion in the ostium of the right coronary artery. We will  plan to schedule the patient for to return for percutaneous coronary  intervention on Tuesday April 13. In the meantime, we will start Plavix  and continue aspirin and start the Toprol-XL 25 mg.  RESULTS: Initially, stenosis in the ostium of the right coronary artery  was estimated at 95%. Following stenting, this improved to less than  10%.  CONCLUSION: Successful PCI of the ostial lesion in the right coronary  using a Xience drug-eluting stent with improvement in diameter narrowing  from 95% to less than 10%.     Assessment and Plan  1. CAD - no current symptoms - continue risk factor modification and secondary prevention.  - start low dose lisinopril 2.5mg  daily for cardiovacular outcome benefits. Check BMET in 2 weeks.  2. Hyperlipidemia - stop zetia, increase atorvastatin to  daily in setting of known CAD.    F/u 1 year   Antoine Poche, M.D.

## 2014-09-07 NOTE — Patient Instructions (Signed)
Your physician wants you to follow-up in: 1 year with Dr Lurena JoinerBranch You will receive a reminder letter in the mail two months in advance. If you don't receive a letter, please call our office to schedule the follow-up appointment.    STOP Zetia   INCREASE Lipitor to 80 mg daily  START Lisinopril to 2.5 mg daily   Please get lab work in 2 weeks BMET    Thank you for choosing  [facility] !

## 2014-09-25 LAB — BASIC METABOLIC PANEL
BUN: 14 mg/dL (ref 6–23)
CO2: 25 meq/L (ref 19–32)
Calcium: 10.1 mg/dL (ref 8.4–10.5)
Chloride: 104 mEq/L (ref 96–112)
Creat: 1.01 mg/dL (ref 0.50–1.10)
Glucose, Bld: 90 mg/dL (ref 70–99)
Potassium: 4.8 mEq/L (ref 3.5–5.3)
SODIUM: 141 meq/L (ref 135–145)

## 2015-03-20 ENCOUNTER — Ambulatory Visit (HOSPITAL_COMMUNITY)
Admission: RE | Admit: 2015-03-20 | Discharge: 2015-03-20 | Disposition: A | Discharge status: Home / Self Care | Payer: Medicare HMO | Source: Ambulatory Visit | Attending: Internal Medicine | Admitting: Internal Medicine

## 2015-03-20 ENCOUNTER — Other Ambulatory Visit (HOSPITAL_COMMUNITY): Payer: Self-pay | Admitting: Internal Medicine

## 2015-03-20 DIAGNOSIS — M79642 Pain in left hand: Secondary | ICD-10-CM | POA: Insufficient documentation

## 2015-03-20 DIAGNOSIS — M19042 Primary osteoarthritis, left hand: Secondary | ICD-10-CM | POA: Diagnosis not present

## 2015-03-20 DIAGNOSIS — M25532 Pain in left wrist: Secondary | ICD-10-CM | POA: Insufficient documentation

## 2015-03-20 DIAGNOSIS — M1990 Unspecified osteoarthritis, unspecified site: Secondary | ICD-10-CM

## 2015-03-20 DIAGNOSIS — M19032 Primary osteoarthritis, left wrist: Secondary | ICD-10-CM | POA: Diagnosis not present

## 2015-03-20 DIAGNOSIS — M1812 Unilateral primary osteoarthritis of first carpometacarpal joint, left hand: Secondary | ICD-10-CM | POA: Diagnosis not present

## 2015-05-01 DIAGNOSIS — I10 Essential (primary) hypertension: Secondary | ICD-10-CM | POA: Diagnosis not present

## 2015-05-01 DIAGNOSIS — E782 Mixed hyperlipidemia: Secondary | ICD-10-CM | POA: Diagnosis not present

## 2015-05-01 DIAGNOSIS — R7301 Impaired fasting glucose: Secondary | ICD-10-CM | POA: Diagnosis not present

## 2015-05-03 DIAGNOSIS — R7301 Impaired fasting glucose: Secondary | ICD-10-CM | POA: Diagnosis not present

## 2015-05-03 DIAGNOSIS — M180 Bilateral primary osteoarthritis of first carpometacarpal joints: Secondary | ICD-10-CM | POA: Diagnosis not present

## 2015-05-03 DIAGNOSIS — I2510 Atherosclerotic heart disease of native coronary artery without angina pectoris: Secondary | ICD-10-CM | POA: Diagnosis not present

## 2015-05-03 DIAGNOSIS — E782 Mixed hyperlipidemia: Secondary | ICD-10-CM | POA: Diagnosis not present

## 2015-09-16 DIAGNOSIS — R7301 Impaired fasting glucose: Secondary | ICD-10-CM | POA: Diagnosis not present

## 2015-09-16 DIAGNOSIS — E782 Mixed hyperlipidemia: Secondary | ICD-10-CM | POA: Diagnosis not present

## 2015-09-17 DIAGNOSIS — I10 Essential (primary) hypertension: Secondary | ICD-10-CM | POA: Diagnosis not present

## 2015-09-17 DIAGNOSIS — I2510 Atherosclerotic heart disease of native coronary artery without angina pectoris: Secondary | ICD-10-CM | POA: Diagnosis not present

## 2015-09-17 DIAGNOSIS — R7301 Impaired fasting glucose: Secondary | ICD-10-CM | POA: Diagnosis not present

## 2015-10-01 ENCOUNTER — Other Ambulatory Visit: Payer: Self-pay | Admitting: Cardiology

## 2015-10-15 ENCOUNTER — Encounter: Payer: Self-pay | Admitting: Cardiology

## 2015-10-15 ENCOUNTER — Ambulatory Visit (INDEPENDENT_AMBULATORY_CARE_PROVIDER_SITE_OTHER): Payer: Medicare HMO | Admitting: Cardiology

## 2015-10-15 VITALS — BP 126/72 | HR 111 | Ht 61.0 in | Wt 158.0 lb

## 2015-10-15 DIAGNOSIS — E785 Hyperlipidemia, unspecified: Secondary | ICD-10-CM | POA: Diagnosis not present

## 2015-10-15 DIAGNOSIS — I25810 Atherosclerosis of coronary artery bypass graft(s) without angina pectoris: Secondary | ICD-10-CM | POA: Diagnosis not present

## 2015-10-15 DIAGNOSIS — R2681 Unsteadiness on feet: Secondary | ICD-10-CM | POA: Diagnosis not present

## 2015-10-15 MED ORDER — NITROGLYCERIN 0.4 MG SL SUBL: 0.4000 mg | SUBLINGUAL_TABLET | SUBLINGUAL | Status: DC | PRN

## 2015-10-15 NOTE — Progress Notes (Signed)
Patient ID: Katrina Gallegos, female   DOB: 05/28/1941, 75 y.o.   MRN: [id number]     Clinical Summary Ms. Katrina Gallegos is a 75 y.o.female seen today for follow up of the following medical problems.   1. CAD - prior DES to RCA in 08/2008, LVEF 60% by LV gram at that time  - denies any chest pain. No SOB or DOE. - she is compliant withher meds.   2. Hyperlipidemia - no recent panel in our system - reports recent labs with Dr Margo AyeHall - currently on statin and fenofibrate   Past Medical History  Diagnosis Date  . Sinus tachycardia   . Dyslipidemia   . Chest pain   . GERD (gastroesophageal reflux disease)      No Known Allergies   Current Outpatient Prescriptions  Medication Sig Dispense Refill  . aspirin EC 81 MG tablet Take 81 mg by mouth daily.    Marland Kitchen. atorvastatin (LIPITOR) 80 MG tablet TAKE 1 TABLET BY MOUTH DAILY. 90 tablet 3  . calcium carbonate (TUMS - DOSED IN MG ELEMENTAL CALCIUM) 500 MG chewable tablet Chew 1 tablet by mouth daily.      . fenofibrate (TRICOR) 145 MG tablet Take 145 mg by mouth daily.    Marland Kitchen. lisinopril (PRINIVIL,ZESTRIL) 2.5 MG tablet Take 1 tablet (2.5 mg total) by mouth daily. 90 tablet 3  . metoprolol (TOPROL-XL) 50 MG 24 hr tablet Take 50 mg by mouth daily.      . nitroGLYCERIN (NITROSTAT) 0.4 MG SL tablet Place 0.4 mg under the tongue every 5 (five) minutes as needed.       No current facility-administered medications for this visit.     No past surgical history on file.   No Known Allergies    No family history on file.   Social History [name] reports that she has never smoked. She has never used smokeless tobacco. Ms. Katrina Gallegos reports that she does not drink alcohol.   Review of Systems CONSTITUTIONAL: No weight loss, fever, chills, weakness or fatigue.  HEENT: Eyes: No visual loss, blurred vision, double vision or yellow sclerae.No hearing loss, sneezing, congestion, runny nose or sore throat.  SKIN: No rash or itching.  CARDIOVASCULAR:    RESPIRATORY: No shortness of breath, cough or sputum.  GASTROINTESTINAL: No anorexia, nausea, vomiting or diarrhea. No abdominal pain or blood.  GENITOURINARY: No burning on urination, no polyuria NEUROLOGICAL: No headache, dizziness, syncope, paralysis, ataxia, numbness or tingling in the extremities. No change in bowel or bladder control.  MUSCULOSKELETAL: No muscle, back pain, joint pain or stiffness.  LYMPHATICS: No enlarged nodes. No history of splenectomy.  PSYCHIATRIC: No history of depression or anxiety.  ENDOCRINOLOGIC: No reports of sweating, cold or heat intolerance. No polyuria or polydipsia.  Marland Kitchen.   Physical Examination There were no vitals filed for this visit. There were no vitals filed for this visit.  Gen: resting comfortably, no acute distress HEENT: no scleral icterus, pupils equal round and reactive, no palptable cervical adenopathy,  CV Resp: Clear to auscultation bilaterally GI: abdomen is soft, non-tender, non-distended, normal bowel sounds, no hepatosplenomegaly MSK: extremities are warm, no edema.  Skin: warm, no rash Neuro:  no focal deficits Psych: appropriate affect   Diagnostic Studies 08/2008 Cath  RESULTS: Left main coronary artery. The left main coronary artery is  free of significant disease.  Left anterior descending artery. The left anterior descending artery  gave rise to three septal perforators and two diagonal branches. These  and __________ were  free of significant disease.  The circumflex artery: The circumflex artery gave rise to a ramus  branch, a marginal branch, and a small posterolateral branch. These  vessels were free of significant disease.  The right coronary artery: The right coronary artery was a moderate-  sized vessel that gave rise to posterior descending branch and three  posterolateral branches. The vessel also gave rise to a right  ventricular branch. There was an 80-90% stenosis at the ostium of the  right  coronary artery. We could see some collateral filling from the  left coronary artery, which appeared to be right ventricular branches.  The left ventriculogram: The left ventriculogram performed in the RAO  projection showed good wall motion with no areas of hypokinesis. The  estimated ejection fraction was 60%.  The left ventricular pressure was 154/28 and __________ pressure was  154/86 with a mean of 116.  CONCLUSION: Coronary artery disease with 80-90% stenosis in the ostium  of the right coronary artery, no significant obstruction in the LAD and  circumflex arteries and normal LV function.  RECOMMENDATIONS: The patient has a tight what appears to be flow-  limiting lesion in the ostium of the right coronary artery. We will  plan to schedule the patient for to return for percutaneous coronary  intervention on Tuesday April 13. In the meantime, we will start Plavix  and continue aspirin and start the Toprol-XL 25 mg.  RESULTS: Initially, stenosis in the ostium of the right coronary artery  was estimated at 95%. Following stenting, this improved to less than  10%.  CONCLUSION: Successful PCI of the ostial lesion in the right coronary  using a Xience drug-eluting stent with improvement in diameter narrowing  from 95% to less than 10%.     Assessment and Plan   1. CAD - no current symptoms. EKG in clinic NSR without ischemic changes - continue current meds  2. Hyperlipidemia - stop fenofibrate due to lack of clinical benefits, continue atorvastati - request labs from pcp   3. Gait instability - will refer to PT for evaluation     Antoine Poche, M.D.

## 2015-10-15 NOTE — Patient Instructions (Signed)
Medication Instructions:  Stop fenofibrate   Labwork: I  am requesting lab work from your pcp  Testing/Procedures: NONE  Follow-Up: Your physician wants you to follow-up in: 1 YEAR.  You will receive a reminder letter in the mail two months in advance. If you don't receive a letter, please call our office to schedule the follow-up appointment.   Any Other Special Instructions Will Be Listed Below (If Applicable).  I HAVE PUT IN A REFERRAL FOR PHYSICAL THERAPY AND SOMEONE WILL CONTACT YOU SOON.    If you need a refill on your cardiac medications before your next appointment, please call your pharmacy.

## 2015-12-31 DIAGNOSIS — E782 Mixed hyperlipidemia: Secondary | ICD-10-CM | POA: Diagnosis not present

## 2015-12-31 DIAGNOSIS — R7301 Impaired fasting glucose: Secondary | ICD-10-CM | POA: Diagnosis not present

## 2016-01-07 DIAGNOSIS — R7301 Impaired fasting glucose: Secondary | ICD-10-CM | POA: Diagnosis not present

## 2016-01-07 DIAGNOSIS — M1812 Unilateral primary osteoarthritis of first carpometacarpal joint, left hand: Secondary | ICD-10-CM | POA: Diagnosis not present

## 2016-01-07 DIAGNOSIS — I2510 Atherosclerotic heart disease of native coronary artery without angina pectoris: Secondary | ICD-10-CM | POA: Diagnosis not present

## 2016-01-07 DIAGNOSIS — E782 Mixed hyperlipidemia: Secondary | ICD-10-CM | POA: Diagnosis not present

## 2016-07-23 DIAGNOSIS — K08409 Partial loss of teeth, unspecified cause, unspecified class: Secondary | ICD-10-CM | POA: Diagnosis not present

## 2016-07-23 DIAGNOSIS — I10 Essential (primary) hypertension: Secondary | ICD-10-CM | POA: Diagnosis not present

## 2016-07-23 DIAGNOSIS — Z0000 Encounter for general adult medical examination without abnormal findings: Secondary | ICD-10-CM | POA: Diagnosis not present

## 2016-07-23 DIAGNOSIS — E785 Hyperlipidemia, unspecified: Secondary | ICD-10-CM | POA: Diagnosis not present

## 2016-07-23 DIAGNOSIS — Z7982 Long term (current) use of aspirin: Secondary | ICD-10-CM | POA: Diagnosis not present

## 2016-07-23 DIAGNOSIS — Z6830 Body mass index (BMI) 30.0-30.9, adult: Secondary | ICD-10-CM | POA: Diagnosis not present

## 2016-07-23 DIAGNOSIS — E669 Obesity, unspecified: Secondary | ICD-10-CM | POA: Diagnosis not present

## 2016-11-12 DIAGNOSIS — Z961 Presence of intraocular lens: Secondary | ICD-10-CM | POA: Diagnosis not present

## 2017-01-02 DIAGNOSIS — R4182 Altered mental status, unspecified: Secondary | ICD-10-CM | POA: Diagnosis not present

## 2017-01-02 DIAGNOSIS — G4700 Insomnia, unspecified: Secondary | ICD-10-CM | POA: Diagnosis not present

## 2017-01-02 DIAGNOSIS — R300 Dysuria: Secondary | ICD-10-CM | POA: Diagnosis not present

## 2017-01-02 DIAGNOSIS — M7981 Nontraumatic hematoma of soft tissue: Secondary | ICD-10-CM | POA: Diagnosis not present

## 2017-01-02 DIAGNOSIS — Z9181 History of falling: Secondary | ICD-10-CM | POA: Diagnosis not present

## 2017-01-02 DIAGNOSIS — Z7689 Persons encountering health services in other specified circumstances: Secondary | ICD-10-CM | POA: Diagnosis not present

## 2017-01-02 DIAGNOSIS — R000 Tachycardia, unspecified: Secondary | ICD-10-CM | POA: Diagnosis not present

## 2017-01-02 DIAGNOSIS — R69 Illness, unspecified: Secondary | ICD-10-CM | POA: Diagnosis not present

## 2017-01-12 ENCOUNTER — Other Ambulatory Visit (HOSPITAL_COMMUNITY): Payer: Self-pay | Admitting: Pulmonary Disease

## 2017-01-12 DIAGNOSIS — R4182 Altered mental status, unspecified: Secondary | ICD-10-CM

## 2017-01-20 ENCOUNTER — Other Ambulatory Visit (HOSPITAL_COMMUNITY): Payer: Self-pay | Admitting: Internal Medicine

## 2017-01-20 ENCOUNTER — Ambulatory Visit (HOSPITAL_COMMUNITY)
Admission: RE | Admit: 2017-01-20 | Discharge: 2017-01-20 | Disposition: A | Discharge status: Home / Self Care | Payer: Medicare HMO | Source: Ambulatory Visit | Attending: Internal Medicine | Admitting: Internal Medicine

## 2017-01-20 DIAGNOSIS — R296 Repeated falls: Secondary | ICD-10-CM | POA: Insufficient documentation

## 2017-01-20 DIAGNOSIS — R4182 Altered mental status, unspecified: Secondary | ICD-10-CM

## 2017-01-20 DIAGNOSIS — S0990XA Unspecified injury of head, initial encounter: Secondary | ICD-10-CM | POA: Diagnosis not present

## 2017-01-20 DIAGNOSIS — G9389 Other specified disorders of brain: Secondary | ICD-10-CM | POA: Diagnosis not present

## 2017-07-28 DIAGNOSIS — I10 Essential (primary) hypertension: Secondary | ICD-10-CM | POA: Diagnosis not present

## 2017-07-28 DIAGNOSIS — H65199 Other acute nonsuppurative otitis media, unspecified ear: Secondary | ICD-10-CM | POA: Diagnosis not present

## 2017-07-28 DIAGNOSIS — R69 Illness, unspecified: Secondary | ICD-10-CM | POA: Diagnosis not present

## 2017-08-06 DIAGNOSIS — N390 Urinary tract infection, site not specified: Secondary | ICD-10-CM | POA: Diagnosis not present

## 2017-08-06 DIAGNOSIS — T148XXA Other injury of unspecified body region, initial encounter: Secondary | ICD-10-CM | POA: Diagnosis not present

## 2017-08-06 DIAGNOSIS — R300 Dysuria: Secondary | ICD-10-CM | POA: Diagnosis not present

## 2017-08-06 DIAGNOSIS — R52 Pain, unspecified: Secondary | ICD-10-CM | POA: Diagnosis not present

## 2017-08-23 DIAGNOSIS — R531 Weakness: Secondary | ICD-10-CM | POA: Diagnosis not present

## 2017-08-23 DIAGNOSIS — R296 Repeated falls: Secondary | ICD-10-CM | POA: Diagnosis not present

## 2017-08-23 DIAGNOSIS — R69 Illness, unspecified: Secondary | ICD-10-CM | POA: Diagnosis not present

## 2017-08-23 DIAGNOSIS — R410 Disorientation, unspecified: Secondary | ICD-10-CM | POA: Diagnosis not present

## 2017-08-26 ENCOUNTER — Other Ambulatory Visit: Payer: Self-pay | Admitting: *Deleted

## 2017-08-26 NOTE — Patient Outreach (Signed)
[facility] Care Management  08/26/2017  Katrina BuddsMary C Dimitrov 04/22/1941 [id number]   Telephone screening  Referral date 08/24/17 Referral source: Dr. Margo AyeHall, PCP office Referral reason : Weakness and increased falls Insurance : [employer]   Initial outreach call placed to patient, unsuccessful unable to make contact, no voice mail setup unable to leave a message.    Plan  Will schedule 2nd outreach call on tomorrow.    Egbert GaribaldiKimberly Glover, RN, [facility] Care [facility],Care Management Coordinator  [phone]- Mobile [phone]- Toll Free Main Office

## 2017-08-27 ENCOUNTER — Other Ambulatory Visit: Payer: Self-pay | Admitting: *Deleted

## 2017-08-27 NOTE — Patient Outreach (Signed)
[facility]) Care Management  08/27/2017  Katrina BuddsMary C Gallegos 01/22/1941 [id number]   Referral date 3/26 Referral source: Dr.Z.Hall,PCP Reason for referral: Weakness, increased falls, HTN Insurance : [employer]  Placed call to patient, explained the purpose of the call , HIPAA information verified.  Patient discussed she doesn't know much about everything going on and requested that I speak with her stepdaughter Katrina Gallegos and provided the contact number she reports Katrina Freestoneatty takes care of her and told her not to answer questions over the phone.   Placed call to YUM! BrandsPatty Gallegos, HIPAA information confirmed, she states patient had told her I called. Explained purpose of the call, she discussed recent visit to PCP office and aware of referral.   Social  Patient lives at home alone, her husband away a few months ago. Katrina Gallegos is stepdaughter of patient she is also POA, provides transportation and  Katrina lives behind patient's home.Katrina discussed patient has become more dependant on her  since the death of her husband, shares patient calls her a least 15 times a hour if she doesn't see her care at home. Katrina Freestoneatty states that patient husband before his sickness took care of patient , cooked for her, tucked her in bed.  Patient has a granddaughter that helps her with a shower a 1 to 2  days a week when possible otherwise bath does sponge baths and stepdaughter helps with meals, as they don't like patient to cook, patient is able to make toast, fix cereal.  Patient has a cane for use at home and she does have a ramp.  Stepdaughter is interested in additional support in home options for patient, and medical alert system.   Conditions Patient history of increased falls, 2 in the last 6 months one fall occurred when patient had diagnosis of UTI. Stepdaughter discussed patient has new diagnosis of Dementia.in February. Patient has history of Hypertension and taking medication, reports it is in  normal range at office visits, has monitor in home but does not check at home.   Medications No cost concerns, family assist with medications organization, picking up prescriptions .   Consent Explained and offered [facility] care [facility] services, Patient's stepdaughter Katrina Freestoneatty Gallegos gets verbal agreement and should receive phone the follow up phone calls, she can be reached at [phone].  [facility] will benefit [facility] consult for home safety evaluation due to report of increased falls.  Patient will benefit [facility] LCSW consult  for requested assistance with resources available for  medical alert system for safety, and additional  personal care services in home.   Plan Will place community care coordinator referral Will place LCSW referral.  RN will mark case as active.    Egbert GaribaldiKimberly Glover, RN, [facility] Care Management,Care Management Coordinator  [phone] Mobile [phone] Toll Free Main Office

## 2017-08-27 NOTE — Patient Outreach (Signed)
[facility]) Care Management  08/27/2017  MEEKA CARTELLI 09-07-1940 [id number]  Care coordination/Referral received  [facility] CM made contact to POA, [name] as mentioned in referral to CM. Patient with dementia Cm discussed [facility]N referral to community CM for Weakness and increased falls Mrs Sprinkle shared main concerns as home safety, poor mobility/falls, life alert, needing to get services "help in her home"    Plans Follow up with Mrs Gilmore on next week for home visit Discussed HHPT for falls/mobility issues and home safety check  Discussed referral to [facility] for community resources Scheduled a home visit Called [facility] d[facility], spoke with skylar to get a call for alarm, alert services to [employer] today   Kimberly L. Noelle Penner, RN, BSN, CCM [facility] Care Management Care Coordinator [phone] week day mobile

## 2017-08-27 NOTE — Progress Notes (Signed)
This encounter was created in error - please disregard.

## 2017-08-27 NOTE — Addendum Note (Signed)
Addended by: Aletta EdouardJAMES, Curby Carswell N on: 08/27/2017 04:53 PM   Modules accepted: Level of Service, SmartSet

## 2017-08-30 ENCOUNTER — Other Ambulatory Visit: Payer: Self-pay | Admitting: *Deleted

## 2017-08-30 ENCOUNTER — Other Ambulatory Visit: Payer: Self-pay
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
## 2017-08-31 DIAGNOSIS — F411 Generalized anxiety disorder: Secondary | ICD-10-CM | POA: Diagnosis not present

## 2017-08-31 DIAGNOSIS — R296 Repeated falls: Secondary | ICD-10-CM | POA: Diagnosis not present

## 2017-08-31 DIAGNOSIS — Z681 Body mass index (BMI) 19 or less, adult: Secondary | ICD-10-CM | POA: Diagnosis not present

## 2017-08-31 DIAGNOSIS — N390 Urinary tract infection, site not specified: Secondary | ICD-10-CM | POA: Diagnosis not present

## 2017-08-31 DIAGNOSIS — Z7982 Long term (current) use of aspirin: Secondary | ICD-10-CM | POA: Diagnosis not present

## 2017-08-31 DIAGNOSIS — H65199 Other acute nonsuppurative otitis media, unspecified ear: Secondary | ICD-10-CM | POA: Diagnosis not present

## 2017-08-31 DIAGNOSIS — Z634 Disappearance and death of family member: Secondary | ICD-10-CM | POA: Diagnosis not present

## 2017-08-31 DIAGNOSIS — R531 Weakness: Secondary | ICD-10-CM | POA: Diagnosis not present

## 2017-08-31 DIAGNOSIS — R69 Illness, unspecified: Secondary | ICD-10-CM | POA: Diagnosis not present

## 2017-08-31 DIAGNOSIS — R410 Disorientation, unspecified: Secondary | ICD-10-CM | POA: Diagnosis not present

## 2017-08-31 DIAGNOSIS — I10 Essential (primary) hypertension: Secondary | ICD-10-CM | POA: Diagnosis not present

## 2017-09-06 DIAGNOSIS — N390 Urinary tract infection, site not specified: Secondary | ICD-10-CM | POA: Diagnosis not present

## 2017-09-06 DIAGNOSIS — I10 Essential (primary) hypertension: Secondary | ICD-10-CM | POA: Diagnosis not present

## 2017-09-06 DIAGNOSIS — R296 Repeated falls: Secondary | ICD-10-CM | POA: Diagnosis not present

## 2017-09-06 DIAGNOSIS — R69 Illness, unspecified: Secondary | ICD-10-CM | POA: Diagnosis not present

## 2017-09-06 DIAGNOSIS — Z7982 Long term (current) use of aspirin: Secondary | ICD-10-CM | POA: Diagnosis not present

## 2017-09-06 DIAGNOSIS — H65199 Other acute nonsuppurative otitis media, unspecified ear: Secondary | ICD-10-CM | POA: Diagnosis not present

## 2017-09-07 ENCOUNTER — Other Ambulatory Visit: Payer: Self-pay | Admitting: *Deleted

## 2017-09-07 NOTE — Patient Outreach (Signed)
[facility]) Care Management  09/07/2017  Katrina Gallegos 02/24/1941 [id number]   CSW had received referral from [facility] RNCM, Marval RegalKim Gibbs that patient would benefit from additional assistance at home and a medical alert system. CSW called & spoke with patient's stepdaughter, Katrina Freestoneatty [phone]) who informed CSW that patient is being evaluated by [employer] this Thursday but that she would not qualify for Medicaid as she owns too much land and has resources in her name. CSW encouraged Katrina Gallegos to talk with an elderlaw attorney about moving the land into her name so that down the road, patient could possibly qualify for Medicaid and get more assistance in the home. Patient's step-daughter states that she has worked at nursing homes before and is opposed to the idea of her going into one in the future. Katrina Gallegos also informed CSW that the money that she receives every month after her medications & groceries that she doesn't have any extra money that she could pay for in-home assistance. Katrina Freestoneatty states that she lives behind her and between her, her husband & her daughter that they look after her and will make sure that she is taken care of. CSW spoke with Katrina Gallegos about medical alert system, but Katrina Freestoneatty states that patient tends to "fiddle" with things and her husband has to fix her watch "all the time" because she fiddles with it and is afraid that if she had a medical alert bracelet or necklace that she would be pressing the button all the time. Patient's step-daughter declined CSW visit and interventions at this time. CSW encouraged her to call if they decide to change their mind or if after they meet with the elderlaw attorney they decide to apply for Medicaid.   CSW will make [facility] RNCM, Kim aware of CSW signing off at this time.    Katrina MaxinKelly Jayvon Mounger, LCSW [facility]  Clinical Social Worker cell #: [phone]

## 2017-09-09 ENCOUNTER — Ambulatory Visit: Payer: Self-pay | Admitting: *Deleted

## 2017-09-09 DIAGNOSIS — R69 Illness, unspecified: Secondary | ICD-10-CM | POA: Diagnosis not present

## 2017-09-09 DIAGNOSIS — Z7982 Long term (current) use of aspirin: Secondary | ICD-10-CM | POA: Diagnosis not present

## 2017-09-09 DIAGNOSIS — I10 Essential (primary) hypertension: Secondary | ICD-10-CM | POA: Diagnosis not present

## 2017-09-09 DIAGNOSIS — R296 Repeated falls: Secondary | ICD-10-CM | POA: Diagnosis not present

## 2017-09-09 DIAGNOSIS — H65199 Other acute nonsuppurative otitis media, unspecified ear: Secondary | ICD-10-CM | POA: Diagnosis not present

## 2017-09-09 DIAGNOSIS — N390 Urinary tract infection, site not specified: Secondary | ICD-10-CM | POA: Diagnosis not present

## 2017-09-10 DIAGNOSIS — I10 Essential (primary) hypertension: Secondary | ICD-10-CM | POA: Diagnosis not present

## 2017-09-10 DIAGNOSIS — N390 Urinary tract infection, site not specified: Secondary | ICD-10-CM | POA: Diagnosis not present

## 2017-09-10 DIAGNOSIS — R69 Illness, unspecified: Secondary | ICD-10-CM | POA: Diagnosis not present

## 2017-09-10 DIAGNOSIS — R296 Repeated falls: Secondary | ICD-10-CM | POA: Diagnosis not present

## 2017-09-10 DIAGNOSIS — H65199 Other acute nonsuppurative otitis media, unspecified ear: Secondary | ICD-10-CM | POA: Diagnosis not present

## 2017-09-10 DIAGNOSIS — Z7982 Long term (current) use of aspirin: Secondary | ICD-10-CM | POA: Diagnosis not present

## 2017-09-13 ENCOUNTER — Other Ambulatory Visit: Payer: Self-pay | Admitting: *Deleted

## 2017-09-13 NOTE — Patient Outreach (Addendum)
[facility]) Care Management   09/13/2017  Katrina Gallegos September 15, 1940 [id number]  Katrina Gallegos is an 77 y.o. female being followed after referred by her MD office for weakness and increased falls. This is the second home visit to see Katrina Gallegos.   Subjective: see notes below  Objective:   BP 128/72   Pulse 86   Resp 20   SpO2 96%   Review of Systems  Constitutional: Positive for malaise/fatigue. Negative for chills, diaphoresis, fever and weight loss.  HENT: Negative for congestion, ear discharge, ear pain, hearing loss, nosebleeds, sinus pain, sore throat and tinnitus.   Eyes: Negative for blurred vision, double vision, photophobia, pain, discharge and redness.  Respiratory: Negative for cough, hemoptysis, sputum production, shortness of breath, wheezing and stridor.   Cardiovascular: Negative for chest pain, palpitations, orthopnea, claudication, leg swelling and PND.  Gastrointestinal: Positive for heartburn. Negative for abdominal pain, blood in stool, constipation, diarrhea, melena, nausea and vomiting.  Genitourinary: Negative for dysuria, flank pain, frequency, hematuria and urgency.  Musculoskeletal: Positive for joint pain. Negative for back pain, falls, myalgias and neck pain.  Skin: Negative for itching and rash.  Neurological: Positive for tremors and weakness. Negative for dizziness, tingling, sensory change, speech change, focal weakness, seizures, loss of consciousness and headaches.  Endo/Heme/Allergies: Negative for environmental allergies and polydipsia. Bruises/bleeds easily.  Psychiatric/Behavioral: Positive for memory loss. Negative for depression, hallucinations, substance abuse and suicidal ideas. The patient is nervous/anxious and has insomnia.     Physical Exam  Constitutional: She appears well-developed and well-nourished.  HENT:  Head: Normocephalic and atraumatic.  Eyes: Pupils are equal, round, and reactive to light. Conjunctivae are  normal.  Neck: Normal range of motion. Neck supple.  Cardiovascular: Normal rate and regular rhythm.  Respiratory: Effort normal and breath sounds normal.  Musculoskeletal: Normal range of motion.  Neurological: She is alert.  Skin: Skin is warm and dry.  Psychiatric: Her mood appears anxious. Cognition and memory are impaired.    Encounter Medications:   Outpatient Encounter Medications as of 09/13/2017  Medication Sig  . aspirin EC 81 MG tablet Take 81 mg by mouth daily.  Marland Kitchen atorvastatin (LIPITOR) 80 MG tablet TAKE 1 TABLET BY MOUTH DAILY. (Patient not taking: Reported on 08/30/2017)  . calcium carbonate (TUMS - DOSED IN MG ELEMENTAL CALCIUM) 500 MG chewable tablet Chew 1 tablet by mouth daily.    . fluticasone (FLONASE) 50 MCG/ACT nasal spray Place into both nostrils daily.  Marland Kitchen lisinopril (PRINIVIL,ZESTRIL) 2.5 MG tablet Take 1 tablet (2.5 mg total) by mouth daily. (Patient not taking: Reported on 08/30/2017)  . metoprolol (TOPROL-XL) 50 MG 24 hr tablet Take 50 mg by mouth daily.    . nitroGLYCERIN (NITROSTAT) 0.4 MG SL tablet Place 1 tablet (0.4 mg total) under the tongue every 5 (five) minutes as needed. Reported on 10/15/2015 (Patient not taking: Reported on 08/30/2017)   No facility-administered encounter medications on file as of 09/13/2017.     Functional Status:   In your present state of health, do you have any difficulty performing the following activities: 08/30/2017  Hearing? N  Vision? N  Difficulty concentrating or making decisions? Y  Walking or climbing stairs? Y  Dressing or bathing? Y  Doing errands, shopping? Y  Preparing Food and eating ? Y  Using the Toilet? N  Managing your Medications? Y  Managing your Finances? Y  Housekeeping or managing your Housekeeping? Y  Some recent data might be hidden    Fall/Depression  Screening:    Fall Risk  08/30/2017  Falls in the past year? Yes  Number falls in past yr: 2 or more  Injury with Fall? No  Risk Factor Category  High  Fall Risk  Risk for fall due to : History of fall(s);Impaired balance/gait;Impaired mobility;Mental status change;Medication side effect  Follow up Education provided;Falls prevention discussed   PHQ 2/9 Scores 08/30/2017 08/27/2017  PHQ - 2 Score 2 1  PHQ- 9 Score 4 -    Assessment:    Spoke with Katrina Gallegos, POA, outside of the home prior to this home visit with Katrina Gallegos.  Katrina Gallegos reports Katrina Gallegos with sleeping concerns and anxious about the "time I spend away from home.  She has been writing down the time and days I leave and come back home.  Most of the time I am out running errands for her." Katrina Gallegos states her father often said Katrina Gallegos "try to be the boss but can't" Katrina Gallegos states she has begun to complain about her dog but did not want Katrina Gallegos to remove the dog from the home.   Cm met with Katrina Gallegos in her trailer. She is watching tv and reports she is pleased to see CM today.  She initially begins to talk about her family "my baby sister, Katrina Gallegos  (in [place]), had her leg taken off."  She reports she was able to call her sister and speak with her while she was hospitalized and offered her encouragement.  Social -Katrina Gallegos reports "I'm lonely here some time"  She discussed displeasure with a female that visited with her recently " I know she came to provide me company but she talked too much and something is wrong with her.", "The girl the other day has a little mental problem.", " I got so tired of her talking" She also states she did not sleep well because her husband's dog, Katrina Gallegos, "bark so much last night", "I only have my dog.", "My husband, Katrina Gallegos, was so good to me. He would do all I want." "I have not slept well the last couple of nights"  Encouraged her to entertain Katrina Gallegos, her dog during the day so he could rest well at night after she confirmed with CM she did not want Katrina Gallegos or another family member to take Katrina Gallegos to their home so she could rest at night   Gastric symptoms During the  home visit she heard a break through announcement on the news and soon after mentioned she had "heartburn", "cause I'm nervous" she began to chew on Tums sitting beside of her on her couch,  She report having to use "two bottles" recently (this was also confirmed by Katrina Gallegos) She reports her medical provider, Katrina Gallegos at [facility] office is aware of her using Tums. Katrina Gallegos reports concerns with her not tolerating Orange juice, although she "loves it"  Katrina Gallegos has recently purchased "low acid orange juice for her"  She has also mentioned having chest pain but not on today.  She voices interest in getting a renewed Rx for NTG.  CM and Katrina Gallegos discussed the importance of only taking NTG when she actually had chest pain to prevent from having side effect from NTG   Anxiety CM inquired if she had been seen by a therapist or counselor and was informed by Katrina Gallegos "Rene Paci been nervous all my life but as I have got older it has gotten worst.",  "no one has ever seen me to find out why" Agrees to behavior  health and/or neurology appointment after CM discussed available services to assist with managing her anxiety. She mentioned that the noise from the lawn mower being used by [employer] and her husband today is causing her to be anxious "they just mow all day long." CM discussed that there are acres of land to be mowed weekly per [employer].   Falls Katrina Gallegos confirms she has not fallen since the last [facility] CM home visit "only stumbled"   Katrina Gallegos and Katrina Gallegos confirms Katrina Gallegos is not receiving meals on wheel but does not like some of food provided Katrina Gallegos offers alternative options for food when she goes grocery shopping for Katrina Gallegos. During the home visit Katrina Gallegos was noted by [facility] to have taken out 2 pies when Katrina Gallegos had placed both in there refrigerator.  Katrina Gallegos states she does not recall removing the pies from the refrigerator.  Memory concerns notes  Home health Katrina Wyse states she is seen by a physical therapist and is  following the therapist's recommendations of 10 repetition of exercises a day  Upcoming appointment  June 26 10 am next appointment with Katrina Gallegos at Dr [facility] office    Updated Katrina Gallegos at the end of the home visit Entered appointments for MD and next [facility] CM home visit in [facility] calendar  Plans Follow up with Katrina Andy in 2 weeks for further home visit No falls since last visit. Continues to have physical therapy to assist with mobility, weakness and safety. Even with meals on wheels she is not drinking and eating well.  VSS, CM spoke with Jinny Blossom at [facility] office to confirm her upcoming appointment and to request Katrina Sousa, NP/PA be notified about medication needs for "heartburn" and a renewal Rx for NTG  To be referred to [facility] SW for [facility] resources/counseling services for increased anxiety  [facility] CM Care Plan Problem One     Most Recent Value  Care Plan Problem One  fall risk  Role Documenting the Problem One  Care Management [place] for Problem One  Active  THN Long Term Goal   over the next 31 days patient will verbalize no falls and increased safety with use of resources inititiatedor discussed   [facility]N Long Term Goal Start Date  08/27/17  Interventions for Problem One Long Term Goal  assess falls, confirmed home health PT services, encouraged following HHPT recommendations, observe mobility  [facility] CM Short Term Goal #1   over the next 14 days patient with work with HHPT/aide to increase ambulation and saftey measures in the home   [facility] CM Short Term Goal #1 Start Date  08/27/17  [facility] CM Short Term Goal #1 Met Date  09/13/17    [facility] CM Care Plan Problem Two     Most Recent Value  Care Plan Problem Two  community resources, DME  Role Documenting the Problem Two  Care Management Coordinator  Care Plan for Problem Two  Active  Interventions for Problem Two Long Term Goal   [facility] SW referral completed on 09/07/17 to offer alert system, medicaid, community resources, personal care services  [facility]  Long Term Goal  over the next 31 days patient/POA will have access to available community resources and DME  [facility] Long Term Goal Start Date  08/30/17  [facility] Long Term Goal Met Date  09/07/17  [facility] CM Short Term Goal #1   over the next 14 days a meals on wheel home assessment as confirmed by patient/POA during home visit or  call outreach  [facility] CM Short Term Goal #1 Start Date  08/30/17  [facility] CM Short Term Goal #1 Met Date   09/13/17  Interventions for Short Term Goal #2   meals on wheels being received  [facility] CM Short Term Goal #2   over the next 7 days patient/POA will consult Psychiatrist about life alert  [facility] CM Short Term Goal #2 Start Date  08/27/17  [facility] CM Short Term Goal #2 Met Date  08/30/17    [facility] CM Care Plan Problem Three     Most Recent Value  Care Plan Problem Three  anxiety/heartburn/chest pain  Role Documenting the Problem Three  Care Management Coordinator  Care Plan for Problem Three  Active  THN Long Term Goal   over the next 31 days patient will report decrease anxiety/heartburn, chest pain symptoms during contact with her and family   [facility]N Long Term Goal Start Date  09/13/17  [facility] CM Short Term Goal #1   over the next 14 days patient will receive treatment/medicines/services for anxiety, chest pain and anxiety  [facility] CM Short Term Goal #1 Start Date  09/13/17  Interventions for Short Term Goal #1  called pcp office for assist with treatment plan/medicine/upcoming appointment, updated poa, referral to [place] for To be referred to [facility] SW for [facility] resources/counseling services for increased anxiety       Shermaine Brigham L. Lavina Hamman, RN, BSN, [place] Coordinator [phone] week day mobile

## 2017-09-21 DIAGNOSIS — R296 Repeated falls: Secondary | ICD-10-CM | POA: Diagnosis not present

## 2017-09-21 DIAGNOSIS — R531 Weakness: Secondary | ICD-10-CM | POA: Diagnosis not present

## 2017-09-27 ENCOUNTER — Other Ambulatory Visit: Payer: Self-pay

## 2017-09-27 ENCOUNTER — Emergency Department (HOSPITAL_COMMUNITY)
Admission: EM | Admit: 2017-09-27 | Discharge: 2017-09-27 | Disposition: A | Discharge status: Home / Self Care | Payer: Medicare HMO | Attending: Emergency Medicine | Admitting: Emergency Medicine

## 2017-09-27 ENCOUNTER — Emergency Department (HOSPITAL_COMMUNITY): Payer: Medicare HMO

## 2017-09-27 ENCOUNTER — Ambulatory Visit: Payer: Self-pay | Admitting: *Deleted

## 2017-09-27 ENCOUNTER — Encounter (HOSPITAL_COMMUNITY): Payer: Self-pay | Admitting: *Deleted

## 2017-09-27 ENCOUNTER — Other Ambulatory Visit: Payer: Self-pay | Admitting: *Deleted

## 2017-09-27 DIAGNOSIS — Z7982 Long term (current) use of aspirin: Secondary | ICD-10-CM | POA: Insufficient documentation

## 2017-09-27 DIAGNOSIS — N390 Urinary tract infection, site not specified: Secondary | ICD-10-CM | POA: Diagnosis not present

## 2017-09-27 DIAGNOSIS — R000 Tachycardia, unspecified: Secondary | ICD-10-CM | POA: Diagnosis not present

## 2017-09-27 DIAGNOSIS — R079 Chest pain, unspecified: Secondary | ICD-10-CM | POA: Diagnosis not present

## 2017-09-27 DIAGNOSIS — E860 Dehydration: Secondary | ICD-10-CM | POA: Diagnosis not present

## 2017-09-27 DIAGNOSIS — R531 Weakness: Secondary | ICD-10-CM

## 2017-09-27 DIAGNOSIS — Z79899 Other long term (current) drug therapy: Secondary | ICD-10-CM | POA: Insufficient documentation

## 2017-09-27 DIAGNOSIS — R404 Transient alteration of awareness: Secondary | ICD-10-CM | POA: Diagnosis not present

## 2017-09-27 LAB — CBC
HEMATOCRIT: 37.9 % (ref 36.0–46.0)
Hemoglobin: 12 g/dL (ref 12.0–15.0)
MCH: 26.2 pg (ref 26.0–34.0)
MCHC: 31.7 g/dL (ref 30.0–36.0)
MCV: 82.8 fL (ref 78.0–100.0)
PLATELETS: 228 10*3/uL (ref 150–400)
RBC: 4.58 MIL/uL (ref 3.87–5.11)
RDW: 14.7 % (ref 11.5–15.5)
WBC: 11 10*3/uL — AB (ref 4.0–10.5)

## 2017-09-27 LAB — URINALYSIS, ROUTINE W REFLEX MICROSCOPIC
BILIRUBIN URINE: NEGATIVE
Glucose, UA: NEGATIVE mg/dL
HGB URINE DIPSTICK: NEGATIVE
Ketones, ur: 5 mg/dL — AB
LEUKOCYTES UA: NEGATIVE
NITRITE: NEGATIVE
PH: 5 (ref 5.0–8.0)
Protein, ur: NEGATIVE mg/dL
SPECIFIC GRAVITY, URINE: 1.025 (ref 1.005–1.030)

## 2017-09-27 LAB — BASIC METABOLIC PANEL
Anion gap: 9 (ref 5–15)
BUN: 16 mg/dL (ref 6–20)
CALCIUM: 9.4 mg/dL (ref 8.9–10.3)
CO2: 26 mmol/L (ref 22–32)
CREATININE: 1.02 mg/dL — AB (ref 0.44–1.00)
Chloride: 104 mmol/L (ref 101–111)
GFR calc Af Amer: >60 mL/min (ref >60)
GFR, EST NON AFRICAN AMERICAN: 53 mL/min — AB (ref >60)
GLUCOSE: 130 mg/dL — AB (ref 65–99)
Potassium: 3.8 mmol/L (ref 3.5–5.1)
SODIUM: 139 mmol/L (ref 135–145)

## 2017-09-27 LAB — LACTIC ACID, PLASMA: LACTIC ACID, VENOUS: 1.1 mmol/L (ref 0.5–1.9)

## 2017-09-27 LAB — CBG MONITORING, ED: GLUCOSE-CAPILLARY: 117 mg/dL — AB (ref 65–99)

## 2017-09-27 MED ORDER — LACTATED RINGERS IV BOLUS
1000.0000 mL | Freq: Once | INTRAVENOUS | Status: AC
  Administered 2017-09-27: 1000 mL via INTRAVENOUS

## 2017-09-27 MED ORDER — SODIUM CHLORIDE 0.9 % IV SOLN
500.0000 mg | Freq: Once | INTRAVENOUS | Status: AC
  Administered 2017-09-27: 500 mg via INTRAVENOUS
  Filled 2017-09-27: qty 500

## 2017-09-27 MED ORDER — ACETAMINOPHEN 325 MG PO TABS
650.0000 mg | ORAL_TABLET | Freq: Once | ORAL | Status: AC
  Administered 2017-09-27: 650 mg via ORAL
  Filled 2017-09-27: qty 2

## 2017-09-27 MED ORDER — CEPHALEXIN 500 MG PO CAPS: 500.0000 mg | ORAL_CAPSULE | Freq: Four times a day (QID) | ORAL | 0 refills | Status: DC

## 2017-09-27 MED ORDER — SODIUM CHLORIDE 0.9 % IV SOLN
1.0000 g | Freq: Once | INTRAVENOUS | Status: AC
  Administered 2017-09-27: 1 g via INTRAVENOUS
  Filled 2017-09-27: qty 10

## 2017-09-27 NOTE — ED Notes (Signed)
Pt returned from xray

## 2017-09-27 NOTE — ED Provider Notes (Signed)
[facility] EMERGENCY DEPARTMENT Provider Note   CSN: [id number] Arrival date & time: 09/27/17  2046     History   Chief Complaint Chief Complaint  Patient presents with  . Weakness    HPI Katrina Gallegos is a 77 y.o. female.   Weakness  Primary symptoms include no focal weakness, no loss of sensation. This is a new problem. The current episode started 6 to 12 hours ago. The problem has not changed since onset.There was no focality noted. Maximum temperature: 100.8. Pertinent negatives include no shortness of breath.    Past Medical History:  Diagnosis Date  . Chest pain   . Dyslipidemia   . GERD (gastroesophageal reflux disease)   . Sinus tachycardia     Patient Active Problem List   Diagnosis Date Noted  . CAD 01/07/2009  . DYSLIPIDEMIA 12/31/2008  . GERD 12/31/2008    History reviewed. No pertinent surgical history.   OB History   None      Home Medications    Prior to Admission medications   Medication Sig Start Date End Date Taking? Authorizing Provider  aspirin EC 81 MG tablet Take 81 mg by mouth daily.   Yes [provider]  calcium carbonate (TUMS - DOSED IN MG ELEMENTAL CALCIUM) 500 MG chewable tablet Chew 1 tablet by mouth daily as needed for indigestion.    Yes [provider]  fluticasone (FLONASE) 50 MCG/ACT nasal spray Place 2 sprays into both nostrils daily as needed for allergies or rhinitis.  08/30/17  Yes Roe Rutherford, NP  metoprolol (TOPROL-XL) 50 MG 24 hr tablet Take 50 mg by mouth daily.     Yes [provider]  nitroGLYCERIN (NITROSTAT) 0.4 MG SL tablet Place 1 tablet (0.4 mg total) under the tongue every 5 (five) minutes as needed. Reported on 10/15/2015 10/15/15  Yes Branch, Dorothe Pea, MD  cephALEXin (KEFLEX) 500 MG capsule Take 1 capsule (500 mg total) by mouth 4 (four) times daily. 09/27/17   Matricia Begnaud, Barbara Cower, MD    Family History History reviewed. No pertinent family history.  Social History Social History    Tobacco Use  . Smoking status: Never Smoker  . Smokeless tobacco: Never Used  Substance Use Topics  . Alcohol use: No    Alcohol/week: 0.0 oz  . Drug use: No     Allergies   Patient has no known allergies.   Review of Systems Review of Systems  Respiratory: Negative for shortness of breath.   Neurological: Positive for weakness. Negative for focal weakness.  All other systems reviewed and are negative.    Physical Exam Updated Vital Signs BP 122/86   Pulse 98   Temp 99 F (37.2 C) (Oral)   Resp 17   Ht  (1.549 m)   Wt 66.7 kg (147 lb)   SpO2 95%   BMI 27.78 kg/m   Physical Exam  Constitutional: She appears well-developed and well-nourished.  HENT:  Head: Normocephalic and atraumatic.  Eyes: Conjunctivae and EOM are normal.  Neck: Normal range of motion.  Cardiovascular: Normal rate and regular rhythm.  Pulmonary/Chest: No stridor. No respiratory distress.  Abdominal: Soft. She exhibits no distension.  Musculoskeletal: Normal range of motion. She exhibits no edema or deformity.  Neurological: She is alert. No cranial nerve deficit. Coordination normal.  Skin: Skin is warm and dry.  Nursing note and vitals reviewed.    ED Treatments / Results  Labs (all labs ordered are listed, but only abnormal results are displayed)  Labs Reviewed  BASIC METABOLIC PANEL - Abnormal; Notable for the following components:      Result Value   Glucose, Bld 130 (*)    Creatinine, Ser 1.02 (*)    GFR calc non Af Amer 53 (*)    All other components within normal limits  CBC - Abnormal; Notable for the following components:   WBC 11.0 (*)    All other components within normal limits  URINALYSIS, ROUTINE W REFLEX MICROSCOPIC - Abnormal; Notable for the following components:   APPearance TURBID (*)    Ketones, ur 5 (*)    Bacteria, UA RARE (*)    All other components within normal limits  CBG MONITORING, ED - Abnormal; Notable for the following components:    Glucose-Capillary 117 (*)    All other components within normal limits  CULTURE, BLOOD (ROUTINE X 2)  CULTURE, BLOOD (ROUTINE X 2)  URINE CULTURE  LACTIC ACID, PLASMA    EKG EKG Interpretation  Date/Time:  Monday September 27 2017 20:57:22 EDT Ventricular Rate:  104 PR Interval:    QRS Duration: 77 QT Interval:  326 QTC Calculation: 429 R Axis:   64 Text Interpretation:  Sinus tachycardia Low voltage, precordial leads No significant change since last tracing Confirmed by Marily Memos [phone]) on 09/27/2017 10:11:43 PM   Radiology Dg Chest 2 View  Result Date: 09/27/2017 CLINICAL DATA:  Fall, weakness, chest pain EXAM: CHEST - 2 VIEW COMPARISON:  09/03/2008 FINDINGS: Mild left basilar scarring/atelectasis. Mild right middle lobe opacity, best visualized overlying the heart on the lateral view. No pleural effusion or pneumothorax. The heart is normal in size. Degenerative changes of the visualized thoracolumbar spine. IMPRESSION: Right middle lobe opacity, suspicious for pneumonia. Electronically Signed   By: Charline Bills M.D.   On: 09/27/2017 22:28    Procedures Procedures (including critical care time)  Medications Ordered in ED Medications  lactated ringers bolus 1,000 mL (0 mLs Intravenous Stopped 09/27/17 2258)  acetaminophen (TYLENOL) tablet 650 mg (650 mg Oral Given 09/27/17 2129)  cefTRIAXone (ROCEPHIN) 1 g in sodium chloride 0.9 % 100 mL IVPB (0 g Intravenous Stopped 09/27/17 2258)  azithromycin (ZITHROMAX) 500 mg in sodium chloride 0.9 % 250 mL IVPB (0 mg Intravenous Stopped 09/27/17 2357)     Initial Impression / Assessment and Plan / ED Course  I have reviewed the triage vital signs and the nursing notes.  Pertinent labs & imaging results that were available during my care of the patient were reviewed by me and considered in my medical decision making (see chart for details).  Generalized weakness, fever, tachycardia. Suspect infection. Will eval for same.    Daughter here and states she helps take care of her and at this point she is too weak to get home. Will ambulate and decide disposition appropriately.   Workup c/w UTI, possibly pneumonia. Will treat for both. Ambulate.   Likely UTI. No cough to suggest pneumonia. Ambulated near baseline. No obvious weakness or falls. No hypoxia. Will dc on keflex for uti.  Final Clinical Impressions(s) / ED Diagnoses   Final diagnoses:  Weakness  Lower urinary tract infectious disease  Dehydration    ED Discharge Orders        Ordered    cephALEXin (KEFLEX) 500 MG capsule  4 times daily     09/27/17 2338       Ramiah Helfrich, Barbara Cower, MD 09/28/17 1914

## 2017-09-27 NOTE — Patient Outreach (Addendum)
[facility] Customer service manager [facility]) Care Management  09/27/2017  EEVA SCHLOSSER [birth year] [id number]   Care coordination   [facility] CM received a call from P Sprinkle, POA, to inform CM that Mrs Mccardle would like to cancel her scheduled appointment with [facility] CM for 09/27/17 and to reschedule "maybe later in the week"  Mrs Sprinkles reports Mrs Gadsby having concerns with "bleeding hemorrhoids and feeling weak today. She is needing to get some rest after being up all night."   Standard home treatment for hemorrhoids discussed Patient has used tucks, stool softeners, ex lax, rest, and fluids CM agreed to cancel scheduled appointment  Plans Cm will make another attempt to reach out to P Sprinkle and Mrs Riemann later in the week   Harrington L. Noelle Penner, RN, BSN, CCM [facility] Care Management Care Coordinator [phone] week day mobile

## 2017-09-27 NOTE — ED Triage Notes (Signed)
Pt brought in by [facility] for c/o an assisted fall; pt states she felt weak and the weakness has been getting worse; pt has hx of utis;

## 2017-09-27 NOTE — ED Notes (Signed)
Pt ambulated out in hallway about 20 feet, once pt returned , ra sats 93%.  Pt tolerated ambulation well.

## 2017-09-28 ENCOUNTER — Emergency Department (HOSPITAL_COMMUNITY)
Admission: EM | Admit: 2017-09-28 | Discharge: 2017-09-28 | Disposition: A | Discharge status: Home / Self Care | Payer: Medicare HMO | Attending: Emergency Medicine | Admitting: Emergency Medicine

## 2017-09-28 ENCOUNTER — Encounter (HOSPITAL_COMMUNITY): Payer: Self-pay | Admitting: *Deleted

## 2017-09-28 ENCOUNTER — Emergency Department (HOSPITAL_COMMUNITY): Payer: Medicare HMO

## 2017-09-28 DIAGNOSIS — E876 Hypokalemia: Secondary | ICD-10-CM | POA: Diagnosis not present

## 2017-09-28 DIAGNOSIS — Z79899 Other long term (current) drug therapy: Secondary | ICD-10-CM | POA: Diagnosis not present

## 2017-09-28 DIAGNOSIS — R69 Illness, unspecified: Secondary | ICD-10-CM | POA: Diagnosis not present

## 2017-09-28 DIAGNOSIS — R197 Diarrhea, unspecified: Secondary | ICD-10-CM | POA: Diagnosis not present

## 2017-09-28 DIAGNOSIS — R05 Cough: Secondary | ICD-10-CM | POA: Diagnosis not present

## 2017-09-28 DIAGNOSIS — Z7982 Long term (current) use of aspirin: Secondary | ICD-10-CM | POA: Diagnosis not present

## 2017-09-28 LAB — BASIC METABOLIC PANEL
ANION GAP: 13 (ref 5–15)
BUN: 16 mg/dL (ref 6–20)
CHLORIDE: 106 mmol/L (ref 101–111)
CO2: 22 mmol/L (ref 22–32)
Calcium: 9.3 mg/dL (ref 8.9–10.3)
Creatinine, Ser: 0.93 mg/dL (ref 0.44–1.00)
GFR calc Af Amer: >60 mL/min (ref >60)
GFR calc non Af Amer: 59 mL/min — ABNORMAL LOW (ref >60)
GLUCOSE: 114 mg/dL — AB (ref 65–99)
POTASSIUM: 3.4 mmol/L — AB (ref 3.5–5.1)
Sodium: 141 mmol/L (ref 135–145)

## 2017-09-28 LAB — CBC WITH DIFFERENTIAL/PLATELET
BASOS ABS: 0 10*3/uL (ref 0.0–0.1)
Basophils Relative: 0 %
Eosinophils Absolute: 0 10*3/uL (ref 0.0–0.7)
Eosinophils Relative: 0 %
HCT: 34 % — ABNORMAL LOW (ref 36.0–46.0)
HEMOGLOBIN: 10.7 g/dL — AB (ref 12.0–15.0)
LYMPHS ABS: 1.3 10*3/uL (ref 0.7–4.0)
LYMPHS PCT: 19 %
MCH: 26.1 pg (ref 26.0–34.0)
MCHC: 31.5 g/dL (ref 30.0–36.0)
MCV: 82.9 fL (ref 78.0–100.0)
Monocytes Absolute: 0.7 10*3/uL (ref 0.1–1.0)
Monocytes Relative: 9 %
NEUTROS ABS: 4.9 10*3/uL (ref 1.7–7.7)
NEUTROS PCT: 72 %
Platelets: 215 10*3/uL (ref 150–400)
RBC: 4.1 MIL/uL (ref 3.87–5.11)
RDW: 14.8 % (ref 11.5–15.5)
WBC: 7 10*3/uL (ref 4.0–10.5)

## 2017-09-28 LAB — I-STAT CG4 LACTIC ACID, ED
LACTIC ACID, VENOUS: 0.67 mmol/L (ref 0.5–1.9)
LACTIC ACID, VENOUS: 0.48 mmol/L — AB (ref 0.5–1.9)

## 2017-09-28 MED ORDER — POTASSIUM CHLORIDE CRYS ER 20 MEQ PO TBCR
20.0000 meq | EXTENDED_RELEASE_TABLET | Freq: Once | ORAL | Status: AC
  Administered 2017-09-28: 20 meq via ORAL
  Filled 2017-09-28: qty 1

## 2017-09-28 NOTE — Discharge Instructions (Signed)
Your testing today revealed that your potassium level was slightly low and with your diarrhea it may go even lower.  Please eat a potassium rich diet including bananas, cooked black beans, potatoes.  The rest of your blood work and your chest x-ray was reassuring, please follow-up with your doctor within 48 hours without fail, return to the emergency department for increasing worsening of symptoms including pain, cough, fever, vomiting or diarrhea

## 2017-09-28 NOTE — ED Provider Notes (Signed)
[facility] EMERGENCY DEPARTMENT Provider Note   CSN: [id number] Arrival date & time: 09/28/17  1926     History   Chief Complaint Chief Complaint  Patient presents with  . abnormal labs    HPI Katrina Gallegos is a 77 y.o. female.  HPI  Has been called back to the ED b/c of abnormal blood cultures from yesterday when she was dx with possible early pna and UTI - given Keflex - she had gram positive cultures on 2 cultures.  Pt denies cough, denies CP, denies f/c/n/v.  She has had some decreased urination and has had some diarrhea overnight - she feels better than how she felt yesterday.  No cough, no dysuria, mild HA.  Sx are gradually improving. Level 5 caveat applies due to dementia  Past Medical History:  Diagnosis Date  . Chest pain   . Dyslipidemia   . GERD (gastroesophageal reflux disease)   . Sinus tachycardia     Patient Active Problem List   Diagnosis Date Noted  . CAD 01/07/2009  . DYSLIPIDEMIA 12/31/2008  . GERD 12/31/2008    History reviewed. No pertinent surgical history.   OB History   None      Home Medications    Prior to Admission medications   Medication Sig Start Date End Date Taking? Authorizing Provider  aspirin EC 81 MG tablet Take 81 mg by mouth daily.   Yes [provider]  calcium carbonate (TUMS - DOSED IN MG ELEMENTAL CALCIUM) 500 MG chewable tablet Chew 1 tablet by mouth daily as needed for indigestion.    Yes [provider]  cephALEXin (KEFLEX) 500 MG capsule Take 1 capsule (500 mg total) by mouth 4 (four) times daily. 09/27/17  Yes Mesner, Barbara Cower, MD  fluticasone (FLONASE) 50 MCG/ACT nasal spray Place 2 sprays into both nostrils daily as needed for allergies or rhinitis.  08/30/17  Yes Roe Rutherford, NP  metoprolol (TOPROL-XL) 50 MG 24 hr tablet Take 50 mg by mouth daily.     Yes [provider]  nitroGLYCERIN (NITROSTAT) 0.4 MG SL tablet Place 1 tablet (0.4 mg total) under the tongue every 5 (five) minutes  as needed. Reported on 10/15/2015 10/15/15  Yes Branch, Dorothe Pea, MD    Family History History reviewed. No pertinent family history.  Social History Social History   Tobacco Use  . Smoking status: Never Smoker  . Smokeless tobacco: Never Used  Substance Use Topics  . Alcohol use: No    Alcohol/week: 0.0 oz  . Drug use: No     Allergies   Patient has no known allergies.   Review of Systems Review of Systems  Unable to perform ROS: Dementia     Physical Exam Updated Vital Signs BP 118/80 (BP Location: Right Arm)   Pulse 87   Temp 98.8 F (37.1 C) (Oral)   Resp 16   Ht  (1.549 m)   Wt 66.7 kg (147 lb)   SpO2 97%   BMI 27.78 kg/m   Physical Exam  Constitutional: She appears well-developed and well-nourished. No distress.  HENT:  Head: Normocephalic and atraumatic.  Mouth/Throat: Oropharynx is clear and moist. No oropharyngeal exudate.  Eyes: Pupils are equal, round, and reactive to light. Conjunctivae and EOM are normal. Right eye exhibits no discharge. Left eye exhibits no discharge. No scleral icterus.  Neck: Normal range of motion. Neck supple. No JVD present. No thyromegaly present.  Cardiovascular: Regular rhythm, normal heart sounds and intact distal pulses. Exam  reveals no gallop and no friction rub.  No murmur heard. HR of 105  Pulmonary/Chest: Effort normal and breath sounds normal. No respiratory distress. She has no wheezes. She has no rales.  Abdominal: Soft. Bowel sounds are normal. She exhibits no distension and no mass. There is no tenderness.  Musculoskeletal: Normal range of motion. She exhibits no edema or tenderness.  Lymphadenopathy:    She has no cervical adenopathy.  Neurological: She is alert. Coordination normal.  Skin: Skin is warm and dry. No rash noted. No erythema.  Psychiatric: She has a normal mood and affect. Her behavior is normal.  Nursing note and vitals reviewed.    ED Treatments / Results  Labs (all labs ordered  are listed, but only abnormal results are displayed) Labs Reviewed  CBC WITH DIFFERENTIAL/PLATELET - Abnormal; Notable for the following components:      Result Value   Hemoglobin 10.7 (*)    HCT 34.0 (*)    All other components within normal limits  BASIC METABOLIC PANEL - Abnormal; Notable for the following components:   Potassium 3.4 (*)    Glucose, Bld 114 (*)    GFR calc non Af Amer 59 (*)    All other components within normal limits  I-STAT CG4 LACTIC ACID, ED - Abnormal; Notable for the following components:   Lactic Acid, Venous 0.48 (*)    All other components within normal limits  I-STAT CG4 LACTIC ACID, ED    EKG None  Radiology Dg Chest 2 View  Result Date: 09/28/2017 CLINICAL DATA:  Cough, positive blood cultures EXAM: CHEST - 2 VIEW COMPARISON:  09/27/2017 FINDINGS: Mild bibasilar opacities, likely atelectasis. No focal consolidation. No pleural effusion or pneumothorax. The heart is normal in size. Mild degenerative changes of the visualized thoracolumbar spine. IMPRESSION: Mild bibasilar opacities, likely atelectasis. Electronically Signed   By: Charline Bills M.D.   On: 09/28/2017 20:55   Dg Chest 2 View  Result Date: 09/27/2017 CLINICAL DATA:  Fall, weakness, chest pain EXAM: CHEST - 2 VIEW COMPARISON:  09/03/2008 FINDINGS: Mild left basilar scarring/atelectasis. Mild right middle lobe opacity, best visualized overlying the heart on the lateral view. No pleural effusion or pneumothorax. The heart is normal in size. Degenerative changes of the visualized thoracolumbar spine. IMPRESSION: Right middle lobe opacity, suspicious for pneumonia. Electronically Signed   By: Charline Bills M.D.   On: 09/27/2017 22:28    Procedures Procedures (including critical care time)  Medications Ordered in ED Medications  potassium chloride SA (K-DUR,KLOR-CON) CR tablet 20 mEq (has no administration in time range)     Initial Impression / Assessment and Plan / ED Course  I  have reviewed the triage vital signs and the nursing notes.  Pertinent labs & imaging results that were available during my care of the patient were reviewed by me and considered in my medical decision making (see chart for details).     The patient does not appear to be uncomfortable, the family member here with her states that she has had some increased diarrhea but otherwise the patient has not had any increased complaints at home.  We will check labs, chest x-ray to further evaluate the course of the patient's illness.  Labs are reassuring with no leukocytosis, mild anemia, mild hypokalemia, preserved renal function, chest x-ray without any acute findings.  Given that her lactic acid was normal and a second lactate was even lower I doubt that the patient is septic.  This is likely a contaminant blood  culture, the patient was given reassurance, encouraged to finish her Keflex and can be discharged safely home.  Patient and family member expressed understanding  Final Clinical Impressions(s) / ED Diagnoses   Final diagnoses:  Hypokalemia  Diarrhea, unspecified type      Eber Hong, MD 09/28/17 2140

## 2017-09-28 NOTE — ED Triage Notes (Signed)
Pt was called to come back to the ED due to positive blood cultures from her visit here yesterday.

## 2017-09-28 NOTE — Addendum Note (Signed)
Addended by: Clinton Gallant on: 09/28/2017 10:39 PM   Modules accepted: Orders

## 2017-09-28 NOTE — ED Notes (Signed)
Pt to xray. Will collect blood once patient is back.

## 2017-09-28 NOTE — ED Notes (Signed)
Date and time results received: 09/28/17 1738   Test: Blood cultures Critical Value: Gram positive cocci in aerobic and anaerobic bottles.  Name of Provider Notified: Dr. Hyacinth Meeker  Orders Received? Or Actions Taken?: EDP requested pt phone number.

## 2017-09-28 NOTE — ED Provider Notes (Signed)
The patient was contacted by phone, she asked me to contact her power of attorney Katrina Gallegos at phone number [phone].  The patient was found to have positive blood cultures x2, gram-positive cocci, she had evidence of pneumonia on her x-ray yesterday as well as a possible urinary tract infection and was prescribed Keflex.  The patient states that she is feeling slightly better, she does however have dementia and so I have asked her family or friend [name] Gallegos to bring her in for an evaluation.  Katrina Gallegos has agreed to this and should bring her shortly.   Eber Hong, MD 09/28/17 [phone]

## 2017-09-28 NOTE — ED Notes (Signed)
Pt ambulating to bathroom.

## 2017-09-29 ENCOUNTER — Other Ambulatory Visit: Payer: Self-pay | Admitting: *Deleted

## 2017-09-29 ENCOUNTER — Telehealth (HOSPITAL_BASED_OUTPATIENT_CLINIC_OR_DEPARTMENT_OTHER): Payer: Self-pay | Admitting: Emergency Medicine

## 2017-09-29 LAB — BLOOD CULTURE ID PANEL (REFLEXED)
Acinetobacter baumannii: NOT DETECTED
CANDIDA GLABRATA: NOT DETECTED
CANDIDA KRUSEI: NOT DETECTED
CANDIDA PARAPSILOSIS: NOT DETECTED
CANDIDA TROPICALIS: NOT DETECTED
Candida albicans: NOT DETECTED
ENTEROBACTER CLOACAE COMPLEX: NOT DETECTED
ESCHERICHIA COLI: NOT DETECTED
Enterobacteriaceae species: NOT DETECTED
Enterococcus species: NOT DETECTED
Haemophilus influenzae: NOT DETECTED
KLEBSIELLA PNEUMONIAE: NOT DETECTED
Klebsiella oxytoca: NOT DETECTED
Listeria monocytogenes: NOT DETECTED
Methicillin resistance: NOT DETECTED
Neisseria meningitidis: NOT DETECTED
PROTEUS SPECIES: NOT DETECTED
Pseudomonas aeruginosa: NOT DETECTED
Serratia marcescens: NOT DETECTED
Staphylococcus aureus (BCID): NOT DETECTED
Staphylococcus species: DETECTED — AB
Streptococcus agalactiae: NOT DETECTED
Streptococcus pneumoniae: NOT DETECTED
Streptococcus pyogenes: NOT DETECTED
Streptococcus species: NOT DETECTED

## 2017-09-29 LAB — URINE CULTURE: Culture: NO GROWTH

## 2017-09-29 NOTE — Patient Outreach (Signed)
[facility]) Care Management  09/29/2017  Katrina Gallegos [birth year] [id number]  Care coordination  [facility] CM called to follow up with Katrina Gallegos after her ED visits x 2 this week.  During the call Katrina Gallegos discussed her anxiety with going to the ED for what she first thought was concerns with her hemorrhoids but resulted in UTI dx. She reports she is feeling better" Katrina Gallegos was informed that CM will contact Katrina Gallegos, POA after it was noted that she was having an increase and fast tone of speech as she spoke about her experiences this week in the ED plus stated "It makes me anxious" Katrina Gallegos reports Katrina Gallegos is running errands today During the call to [place] reports receiving 2 missed calls from Katrina Gallegos. Katrina Gallegos reports incontinence of urine and stool in the last 3-5 days, "has been using diapers." .  Katrina Gallegos was given medications to use for UTI on 09/28/17. Katrina Gallegos reports Katrina Gallegos was in the floor prior to EMS arrival and would not assist the family with standing to get off the floor. Katrina Gallegos noted a change in ambulation on 09/28/17 after the ED visit was completed "She was walking fine with my husband leaving the ED but started walking in baby steps as she got closer to the car when she saw me." She was provided with a new walker and Katrina Gallegos reports HHPT services has recently stopped. Katrina Gallegos mentioned a "nurse has visited to instruct her on what foods to take for the constipation.  She went overboard with the apple juice."    Behavioral health Katrina Gallegos confirms Katrina Gallegos made a referral to Behavioral health recently after Katrina Gallegos discussed possible concerns with autism. Katrina Gallegos has a grandchild with autism and she reports Katrina Gallegos exhibits similar s/s. Katrina Gallegos inquired about the status of this referral. CM contacted Katrina Gallegos office to find out that Katrina Gallegos with referred to [facility] outpatient behavioral health with a pending status  Plan Updated [facility] SW, [employer] [employer] [facility] [employer]  health referral from [place], NP/PA To update Patient and Katrina Gallegos on [facility] referral status as discussed with staff at primary care MD office Cm made contact with Katrina Gallegos office staff to confirm the Behavioral health referral was sent to "[place] outpatient behavioral health" Informed them CM would update the pt/family  [facility] CM Care Plan Problem One     Most Recent Value  Care Plan Problem One  fall risk  Role Documenting the Problem One  Care Management Coordinator  Care Plan for Problem One  Active  [facility] Long Term Goal   over the next 31 days patient will verbalize no falls and increased safety with use of resources inititiatedor discussed   [facility] Long Term Goal Start Date  08/27/17  Interventions for Problem One Long Term Goal  encouraged use of new walker  [facility] CM Short Term Goal #1   over the next 14 days patient with work with HHPT/aide to increase ambulation and saftey measures in the home   [facility] CM Short Term Goal #1 Start Date  08/27/17  [facility] CM Short Term Goal #1 Met Date  09/13/17    [facility] CM Care Plan Problem Two     Most Recent Value  Care Plan Problem Two  community resources, DME  Role Documenting the Problem Two  Care Management [place] for Problem Two  Active  [facility]N Long Term Goal  over the next 31 days patient/POA will have access to available  community resources and DME  [facility] Long Term Goal Start Date  08/30/17  [facility] Long Term Goal Met Date  09/07/17  [facility] CM Short Term Goal #1   over the next 14 days a meals on wheel home assessment as confirmed by patient/POA during home visit or call outreach  [facility] CM Short Term Goal #1 Start Date  08/30/17  [facility] CM Short Term Goal #1 Met Date   09/13/17  THN CM Short Term Goal #2   over the next 7 days patient/POA will consult Psychiatrist about life alert  THN CM Short Term Goal #2 Start Date  08/27/17  [facility] CM Short Term Goal #2 Met Date  08/30/17    [facility] CM Care Plan Problem Three     Most Recent Value  Care Plan Problem  Three  anxiety/heartburn/chest pain  Role Documenting the Problem Three  Care Management Coordinator  Care Plan for Problem Three  Active  [facility]N Long Term Goal   over the next 31 days patient will report decrease anxiety/heartburn, chest pain symptoms during contact with her and family   [facility]N Long Term Goal Start Date  09/13/17  Interventions for Problem Three Long Term Goal  discussed anxiety, encouragement provided Follow up with primary MD about [facility] referral for anxiety  [facility]N CM Short Term Goal #1   over the next 14 days patient will receive treatment/medicines/services for anxiety, chest pain and anxiety  [facility] CM Short Term Goal #1 Start Date  09/13/17  Interventions for Short Term Goal #1  reviewed medications, discussed anxiety, encouragement provided Follow up with primary MD about [facility] referral for anxiety, collaborated with [facility] SW Referral to [place]. Lavina Hamman, RN, BSN, [place] Coordinator [phone] week day mobile

## 2017-09-29 NOTE — Telephone Encounter (Signed)
Received call from lab with positive blood cultures. Results discussed with Dr. Nicanor Alcon and pt's chart reviewed. Pt has re-evaluated yesterday in ED for positive blood cultures. No further action necessary per Dr. Nicanor Alcon.

## 2017-10-01 LAB — CULTURE, BLOOD (ROUTINE X 2): SPECIAL REQUESTS: ADEQUATE

## 2017-10-02 ENCOUNTER — Telehealth: Payer: Self-pay

## 2017-10-02 LAB — CULTURE, BLOOD (ROUTINE X 2)
CULTURE: NO GROWTH
SPECIAL REQUESTS: ADEQUATE

## 2017-10-02 NOTE — Telephone Encounter (Signed)
+   BC from ED visit 09/27/17 Already ;addressed per Sharrie Rothman Pharm D

## 2017-10-04 ENCOUNTER — Other Ambulatory Visit: Payer: Self-pay | Admitting: *Deleted

## 2017-10-04 NOTE — Patient Outreach (Addendum)
[facility]) Care Management  10/04/2017  KYLEENA SCHEIRER May 10, 1941 [id number]   Care coordination  [facility] Cm called [place] [facility] at [address] [place] to continue to attempt to follow up on the behavioral health referral for this patient per [name] request from last week. Consulted Dr Margo Aye office on last week Lupita Leash at [facility] is unable to find a referral for the patient at this time.  She offered a fax number of [phone] for referral to be sent prn.  [facility] Cm spoke with [name] who states no calls have been received from [facility] at this time but she had been given a number by Dr Margo Aye office to check on the referral. She does not have the number at the time of this call.  Cm encourage contact to be made at the number offered to her by MD office when found. She states Mrs Karpf has an ED follow up appointment 10/05/17 at 11 am to see Toni Amend, NP/PA  Concern of today [name] asked "can I ask you a question?" She asked CM " Can someone be walking early in the morning and then be in the bed and not be able to walk by the afternoon?"  CM inquired if she was referring to Mrs Osterlund and she confirmed she was referring to Mrs Bielak.  [name] informed Cm that Mrs Jia has been in the bed and would not get up nor attempt to move extremities to get up.  [name] reports Mrs Nourse has had episodes of being incontinent of urine and stool in her bed.  [name] confirmed Mrs Swetz was walking with earlier in the day.  [name] confirms the HHPT staff never discussed any concerns with Mrs Malenfant ambulation during all the home visits. [name] reports Mrs Redmann refusal to use her new walker on the day she returned from the ED last week.  Cm encouraged [name] to discuss with Toni Amend, NP/PA on tomorrow office visit.  [name] to visit Mrs Morace later in the day and contact CM prn.  [name] discussed with her husband during the call that Mrs Spizzirri was "acting out"   Plans:  Cm to follow up  with Mrs Focht and Alexia Freestone on this week and plan for case closure Cm discussed case closure this week with Alexia Freestone, [place] Call to [facility] to speak with Rexford Maus the [facility] coordinator They confirmed Mrs Crow did well during  ALL her HHPT sessions, "plateau", was safe and at prior level of care and discharged her on 09/22/17. None of the notes reported any difficulty with walking    Larua Collier L. Noelle Penner, RN, BSN, CCM [facility] Care Management Care Coordinator [phone] week day mobile

## 2017-10-05 ENCOUNTER — Other Ambulatory Visit: Payer: Self-pay | Admitting: *Deleted

## 2017-10-05 DIAGNOSIS — N390 Urinary tract infection, site not specified: Secondary | ICD-10-CM | POA: Diagnosis not present

## 2017-10-05 DIAGNOSIS — R69 Illness, unspecified: Secondary | ICD-10-CM | POA: Diagnosis not present

## 2017-10-05 DIAGNOSIS — Z681 Body mass index (BMI) 19 or less, adult: Secondary | ICD-10-CM | POA: Diagnosis not present

## 2017-10-05 NOTE — Patient Outreach (Signed)
[facility]) Care Management   10/05/2017  Katrina Gallegos 11/09/1940 [id number]  Katrina Gallegos is an 77 y.o. female  Subjective:  See notes below  Objective:   BP (!) 144/78   Wt 149 lb (67.6 kg)   SpO2 96%   BMI 28.15 kg/m   Review of Systems  Constitutional: Negative.  Negative for chills, diaphoresis, fever, malaise/fatigue and weight loss.  HENT: Negative.  Negative for congestion, ear discharge, ear pain, hearing loss, nosebleeds, sinus pain, sore throat and tinnitus.        Missing teeth  Eyes: Negative.  Negative for blurred vision, double vision, photophobia, pain, discharge and redness.  Respiratory: Negative.  Negative for cough, hemoptysis, sputum production, shortness of breath, wheezing and stridor.   Cardiovascular: Negative.  Negative for chest pain, palpitations, orthopnea, claudication, leg swelling and PND.  Gastrointestinal: Negative.  Negative for abdominal pain, blood in stool, constipation, diarrhea, heartburn, melena, nausea and vomiting.       Gas pain and takes tums "it goes away"   Genitourinary: Negative.  Negative for dysuria, flank pain, frequency, hematuria and urgency.  Musculoskeletal: Positive for joint pain. Negative for back pain, falls, myalgias and neck pain.  Skin: Negative.  Negative for itching and rash.  Neurological: Negative.  Negative for dizziness, tingling, tremors, sensory change, speech change, focal weakness, seizures, loss of consciousness, weakness and headaches.  Endo/Heme/Allergies: Negative for environmental allergies and polydipsia. Bruises/bleeds easily.       Noted bruises left from iv sticks at ED on both hands and Noted a bruise on greenish yellow right side of her eye  Psychiatric/Behavioral: Positive for memory loss. Negative for depression, hallucinations, substance abuse and suicidal ideas. The patient is nervous/anxious. The patient does not have insomnia.     Physical Exam  Constitutional: She is  oriented to person, place, and time. She appears well-developed and well-nourished.  HENT:  Head: Normocephalic and atraumatic.  Eyes: Pupils are equal, round, and reactive to light. Conjunctivae are normal.  Neck: Normal range of motion. Neck supple.  Cardiovascular: Normal rate and regular rhythm.  Respiratory: Effort normal and breath sounds normal.  GI: Soft. Bowel sounds are normal.  Neurological: She is alert and oriented to person, place, and time.  Skin: Skin is warm and dry.    Encounter Medications:   Outpatient Encounter Medications as of 10/05/2017  Medication Sig  . aspirin EC 81 MG tablet Take 81 mg by mouth daily.  . calcium carbonate (TUMS - DOSED IN MG ELEMENTAL CALCIUM) 500 MG chewable tablet Chew 1 tablet by mouth daily as needed for indigestion.   . cephALEXin (KEFLEX) 500 MG capsule Take 1 capsule (500 mg total) by mouth 4 (four) times daily.  . fluticasone (FLONASE) 50 MCG/ACT nasal spray Place 2 sprays into both nostrils daily as needed for allergies or rhinitis.   . metoprolol (TOPROL-XL) 50 MG 24 hr tablet Take 50 mg by mouth daily.    . nitroGLYCERIN (NITROSTAT) 0.4 MG SL tablet Place 1 tablet (0.4 mg total) under the tongue every 5 (five) minutes as needed. Reported on 10/15/2015   No facility-administered encounter medications on file as of 10/05/2017.     Functional Status:   In your present state of health, do you have any difficulty performing the following activities: 09/29/2017 08/30/2017  Hearing? - N  Vision? - N  Difficulty concentrating or making decisions? - Y  Walking or climbing stairs? - Y  Dressing or bathing? - Y  Doing  errands, shopping? - Y  Preparing Food and eating ? - Y  Using the Toilet? - N  In the past six months, have you accidently leaked urine? Y -  Do you have problems with loss of bowel control? Y -  Managing your Medications? - Y  Managing your Finances? - Y  Housekeeping or managing your Housekeeping? - Y  Some recent data might  be hidden    Fall/Depression Screening:    Fall Risk  08/30/2017  Falls in the past year? Yes  Number falls in past yr: 2 or more  Injury with Fall? No  Risk Factor Category  High Fall Risk  Risk for fall due to : History of fall(s);Impaired balance/gait;Impaired mobility;Mental status change;Medication side effect  Follow up Education provided;Falls prevention discussed   PHQ 2/9 Scores 08/30/2017 08/27/2017  PHQ - 2 Score 2 1  PHQ- 9 Score 4 -    Assessment:    Met with Katrina Gallegos and Katrina Gallegos at her Primary MD office to see Katrina Gallegos  She walked in the MD office using her rolling walker with much encouragement. CM and [name] discussed and provided encouragement to Katrina Armbrister related to increasing her efforts to be as independent as possible especially when she may need to be repositioned.  [name] states Katrina Gallegos provides no effort to assist her nor other family when Katrina Gallegos needs repositioning. She has been reported by [name] to "claim she is not able to move or help [place] move her."  Cm discussed her call to advanced home care with Katrina Gallegos, Katrina Lawhead and Katrina Gallegos to confirm she was doing well with ambulation during ALL HHPT sessions and there should not be any reason for Katrina Louth to have episodes on stiffness or the inability to reposition herself independently.  Katrina Gallegos voiced understanding and reports she will try to help in repositioning herself more. Discuss [place] POA regulations related to orientation and when POA services start when a person is deemed incompetent.  Discussed the meaning of incompetency.  Discussed the difference in guardianship and POA services Katrina Gallegos voiced understanding.  Wt 149 lbs today in the office Katrina Gallegos states she gained weight Last wt was 147 She reports she has been eating more She does not remember if she took her medicine this morning "I cant remember I clare I can't" when she was asked by office staff  Fluids - urine was amber without sediments  Encouraged increased fluids and alternatives to water discussed.   CM assisted Katrina Gallegos with going to the bathroom to obtain her urine specimen needed as she initially stated she could not do it by herself. When she was in the restroom she was able to independently obtain the specimen with minimal assistance.  At this time CM noted her wiping of her perineal risks for recurrent UTIs and Cm began education on wiping her perineal to reduce risks of recurrent UTIs  UTIs Discuss and instructed on how to wipe from front to back to decrease risk of UTIs. Katrina Sousa, NP/PA re discussed these instructions also   "I'm lonely" CM and [name] encouraged her to think about activities she would like to start participating in. 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." NP/PA denies autism related nonverbal agree to delay behavioral health referral  NP/PA discussed other options for Katrina Velasquez NP/PA states Katrina Hosmer may have cognitive impairment or dementia become of reported "clingy" behavior "aging makes it gets worse" Katrina Mattson agrees with this and reports  "Yeah I get mixed ups sometimes" Her NP/PA states UTIs causes similar issues The NP/PA offered many medication options and discussed the ones that the office has tried with fail attempts in the past like Zoloft, lexapro, etc NP/PA discussed that Aricept or possibly another dementia medication may not assist with Katrina Maselli's anxiety  No referral will be provided from the MD office for behavioral counseling or psychiatry at this time but they will try oral medication  (mirtazapine 7.5 mg)  and increase or decrease prn    Plan:  Case closure discussed and agreed upon by Katrina Arvil Chaco and NP/PA CM to update [facility] Gildardo Griffes on MD visit and case closure  SW to follow for anxiety resources, follow up care   Letters to be sent to MD and patient for case closure for goals meet (fall risk) Pt has met goals - had HHPT sessions successfully  [facility] CM Care Plan Problem One     Most Recent Value  Care Plan Problem One  fall risk  Role Documenting the Problem One  Care Management Coordinator  Care Plan for Problem One  Active  [facility] Long Term Goal   over the next 31 days patient will verbalize no falls and increased safety with use of resources inititiatedor discussed   [facility] Long Term Goal Start Date  08/27/17  [facility] Long Term Goal Met Date  10/05/17  [facility] CM Short Term Goal #1   over the next 14 days patient with work with HHPT/aide to increase ambulation and saftey measures in the home   [facility] CM Short Term Goal #1 Start Date  08/27/17  [facility] CM Short Term Goal #1 Met Date  09/13/17    [facility] CM Care Plan Problem Two     Most Recent Value  Care Plan Problem Two  community resources, DME  Role Documenting the Problem Two  Care Management Coordinator  Care Plan for Problem Two  Active  [facility] Long Term Goal  over the next 31 days patient/POA will have access to available community resources and DME  [facility] Long Term Goal Start Date  08/30/17  [facility] Long Term Goal Met Date  09/07/17  [facility] CM Short Term Goal #1   over the next 14 days a meals on wheel home assessment as confirmed by patient/POA during home visit or call outreach  [facility] CM Short Term Goal #1 Start Date  08/30/17  [facility] CM Short Term Goal #1 Met Date   09/13/17  [facility] CM Short Term Goal #2   over the next 7 days patient/POA will consult Psychiatrist about life alert  [facility] CM Short Term Goal #2 Start Date  08/27/17  [facility] CM Short Term Goal #2 Met Date  08/30/17    [facility] CM Care Plan Problem Three     Most Recent  Value  Care Plan Problem Three  anxiety/heartburn/chest pain  Role Documenting the Problem Three  Care Management [place] for Problem Three  Active  [facility] Long Term Goal   over the next 31 days patient will report decrease anxiety/heartburn, chest pain symptoms during contact with  her and family   [facility] Long Term Goal Start Date  09/13/17  [facility] Long Term Goal Met Date  10/05/17  [facility] CM Short Term Goal #1   over the next 14 days patient will receive treatment/medicines/services for anxiety, chest pain and anxiety  [facility] CM Short Term Goal #1 Start Date  09/13/17  [facility] CM Short Term Goal #1 Met Date  10/05/17        Joelene Millin L. Lavina Hamman, RN, BSN, [place] Coordinator [phone] week day mobile

## 2017-10-11 ENCOUNTER — Other Ambulatory Visit: Payer: Self-pay | Admitting: *Deleted

## 2017-10-11 NOTE — Patient Outreach (Signed)
[facility]) Care Management  10/11/2017  SAYSHA MENTA 01-Jul-1940 [id number]   CSW had received referral from [facility], Kim for behavioral health resources, counseling services, appointment forincreased anxiety and memory. 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   [name] reports that patient was started on 7.5 mg of mirtazapine (15 mg, cut in half) which has been making her sleepy during the day. Patient originally was set to take it before she went to bed, but she was forgetting it to take it, so daughter has been giving it to her during the day. Her husband passed away in 2022-08-17 and patient seemed to be depressed.   CSW checked with [facility] RNCM, Kim who attended her PCP appointment with Toni Amend, NP with Dr. Margo Aye who did not feel patient would benefit from going to behavioral health. Toni Amend feels that she could manage the medications. CSW will check back with patient & daughter in 1 month to see how patient reacts to addition of mirtazapine.   Lincoln Maxin, LCSW [facility]  Clinical Social Worker cell #: [phone]

## 2017-11-11 ENCOUNTER — Other Ambulatory Visit: Payer: Self-pay | Admitting: *Deleted

## 2017-11-12 NOTE — Patient Outreach (Signed)
[facility]) Care Management  11/12/2017  Katrina BuddsMary C Gallegos 05/07/1941 [id number]   CSW called & spoke with patient to discuss anxiety. Patient reports that she feels like she's been doing much better since starting the medicine that Toni Amendourtney, NP with Dr. Scharlene GlossHall's office had started her on. Patient still seemed a little anxious over the phone talking about how her husband had passed away in February and she lives alone but does not want to look into independent or assisted living. Patient lives next door to her son & daughter-in-law who are very helpful and take her to doctors appointments and grocery shopping. CSW spoke with patient about [place] but patient declined, stating that she doesn't really like to meet new people but was appreciative of the information and options provided. CSW will sign off at this time as no further CSW needs identified at this time.    Katrina MaxinKelly Devron Cohick, LCSW [facility]  Clinical Social Worker cell #: [phone]

## 2017-11-24 ENCOUNTER — Other Ambulatory Visit: Payer: Self-pay | Admitting: *Deleted

## 2017-11-24 DIAGNOSIS — Z681 Body mass index (BMI) 19 or less, adult: Secondary | ICD-10-CM | POA: Diagnosis not present

## 2017-11-24 DIAGNOSIS — Z0000 Encounter for general adult medical examination without abnormal findings: Secondary | ICD-10-CM | POA: Diagnosis not present

## 2017-11-24 DIAGNOSIS — I2510 Atherosclerotic heart disease of native coronary artery without angina pectoris: Secondary | ICD-10-CM | POA: Diagnosis not present

## 2017-11-24 DIAGNOSIS — N390 Urinary tract infection, site not specified: Secondary | ICD-10-CM | POA: Diagnosis not present

## 2017-11-24 DIAGNOSIS — R7301 Impaired fasting glucose: Secondary | ICD-10-CM | POA: Diagnosis not present

## 2017-11-24 DIAGNOSIS — I10 Essential (primary) hypertension: Secondary | ICD-10-CM | POA: Diagnosis not present

## 2017-11-24 DIAGNOSIS — R410 Disorientation, unspecified: Secondary | ICD-10-CM | POA: Diagnosis not present

## 2017-11-24 DIAGNOSIS — R69 Illness, unspecified: Secondary | ICD-10-CM | POA: Diagnosis not present

## 2017-11-24 DIAGNOSIS — Z7409 Other reduced mobility: Secondary | ICD-10-CM | POA: Diagnosis not present

## 2017-11-24 DIAGNOSIS — E782 Mixed hyperlipidemia: Secondary | ICD-10-CM | POA: Diagnosis not present

## 2017-11-24 DIAGNOSIS — R300 Dysuria: Secondary | ICD-10-CM | POA: Diagnosis not present

## 2017-11-24 DIAGNOSIS — E119 Type 2 diabetes mellitus without complications: Secondary | ICD-10-CM | POA: Diagnosis not present

## 2017-11-24 DIAGNOSIS — R531 Weakness: Secondary | ICD-10-CM | POA: Diagnosis not present

## 2017-11-24 NOTE — Patient Outreach (Signed)
[facility]) Care Management  11/24/2017  Katrina BuddsMary C Drakeford 10/02/1940 [id number]   Care coordination    [facility] CM received a call from [place], family, POA for the patient to ask a question about DME/previous home health services Katrina Freestoneatty is able to verify HIPAA Katrina Gallegos states Mrs Katrina BeanStaley was seen by Katrina Gallegos at Dr Katrina GlossHall's office today and given a Rx for a rollator to assist with better mobility as Mrs Katrina BeanStaley is not doing better (sleeping better with use of Remeron, and not calling family with small concerns as much, no further UTIs).  Mrs Katrina BeanStaley has shown interest in going out to stores to include [place] but can not tolerate ambulating for extended timeframes  Katrina Gallegos reports during Mrs Marylee FlorasStaley's past HHPT session she was recommended a standard walker and still has it.   [facility] who informed Katrina Freestoneatty they would not be able to bill the insurance agency because Mrs Katrina BeanStaley has obtained her previus walker within the last 2 years  Cm assisted Katrina Gallegos by providing the contact number to the local [place] 336 [phone] and to the main [facility] office [phone]) Cm updated Katrina Gallegos on the change in this Cm's role and discussedCM also encouraged a call back to the primary MD if further orders may be needed  Katrina Hofstra L. Noelle PennerGibbs, RN, BSN, CCM [facility] Telephonic Care Management Care Coordinator Direct number [phone]  [facility] number [phone] Fax number [phone]

## 2018-06-07 DIAGNOSIS — H65199 Other acute nonsuppurative otitis media, unspecified ear: Secondary | ICD-10-CM | POA: Diagnosis not present

## 2018-06-07 DIAGNOSIS — I10 Essential (primary) hypertension: Secondary | ICD-10-CM | POA: Diagnosis not present

## 2018-06-07 DIAGNOSIS — Z7409 Other reduced mobility: Secondary | ICD-10-CM | POA: Diagnosis not present

## 2018-06-07 DIAGNOSIS — R410 Disorientation, unspecified: Secondary | ICD-10-CM | POA: Diagnosis not present

## 2018-06-07 DIAGNOSIS — R7301 Impaired fasting glucose: Secondary | ICD-10-CM | POA: Diagnosis not present

## 2018-06-07 DIAGNOSIS — N390 Urinary tract infection, site not specified: Secondary | ICD-10-CM | POA: Diagnosis not present

## 2018-06-07 DIAGNOSIS — R300 Dysuria: Secondary | ICD-10-CM | POA: Diagnosis not present

## 2018-06-07 DIAGNOSIS — R69 Illness, unspecified: Secondary | ICD-10-CM | POA: Diagnosis not present

## 2018-06-07 DIAGNOSIS — Z712 Person consulting for explanation of examination or test findings: Secondary | ICD-10-CM | POA: Diagnosis not present

## 2018-06-07 DIAGNOSIS — E782 Mixed hyperlipidemia: Secondary | ICD-10-CM | POA: Diagnosis not present

## 2018-06-07 DIAGNOSIS — R296 Repeated falls: Secondary | ICD-10-CM | POA: Diagnosis not present

## 2018-06-07 DIAGNOSIS — Z23 Encounter for immunization: Secondary | ICD-10-CM | POA: Diagnosis not present

## 2018-06-07 DIAGNOSIS — Z634 Disappearance and death of family member: Secondary | ICD-10-CM | POA: Diagnosis not present

## 2018-06-07 DIAGNOSIS — I2510 Atherosclerotic heart disease of native coronary artery without angina pectoris: Secondary | ICD-10-CM | POA: Diagnosis not present

## 2018-06-07 DIAGNOSIS — Z681 Body mass index (BMI) 19 or less, adult: Secondary | ICD-10-CM | POA: Diagnosis not present

## 2018-10-12 DIAGNOSIS — Z0000 Encounter for general adult medical examination without abnormal findings: Secondary | ICD-10-CM | POA: Diagnosis not present

## 2018-10-12 DIAGNOSIS — R69 Illness, unspecified: Secondary | ICD-10-CM | POA: Diagnosis not present

## 2018-11-14 DIAGNOSIS — N3000 Acute cystitis without hematuria: Secondary | ICD-10-CM | POA: Diagnosis not present

## 2018-11-14 DIAGNOSIS — R410 Disorientation, unspecified: Secondary | ICD-10-CM | POA: Diagnosis not present

## 2018-11-18 DIAGNOSIS — R69 Illness, unspecified: Secondary | ICD-10-CM | POA: Diagnosis not present

## 2018-11-18 DIAGNOSIS — W19XXXA Unspecified fall, initial encounter: Secondary | ICD-10-CM | POA: Diagnosis not present

## 2018-12-13 DIAGNOSIS — I2510 Atherosclerotic heart disease of native coronary artery without angina pectoris: Secondary | ICD-10-CM | POA: Diagnosis not present

## 2018-12-13 DIAGNOSIS — N390 Urinary tract infection, site not specified: Secondary | ICD-10-CM | POA: Diagnosis not present

## 2018-12-13 DIAGNOSIS — Z6830 Body mass index (BMI) 30.0-30.9, adult: Secondary | ICD-10-CM | POA: Diagnosis not present

## 2018-12-13 DIAGNOSIS — N3000 Acute cystitis without hematuria: Secondary | ICD-10-CM | POA: Diagnosis not present

## 2018-12-13 DIAGNOSIS — R69 Illness, unspecified: Secondary | ICD-10-CM | POA: Diagnosis not present

## 2018-12-13 DIAGNOSIS — E782 Mixed hyperlipidemia: Secondary | ICD-10-CM | POA: Diagnosis not present

## 2018-12-13 DIAGNOSIS — Z0000 Encounter for general adult medical examination without abnormal findings: Secondary | ICD-10-CM | POA: Diagnosis not present

## 2018-12-13 DIAGNOSIS — Z681 Body mass index (BMI) 19 or less, adult: Secondary | ICD-10-CM | POA: Diagnosis not present

## 2018-12-13 DIAGNOSIS — R7301 Impaired fasting glucose: Secondary | ICD-10-CM | POA: Diagnosis not present

## 2018-12-13 DIAGNOSIS — E669 Obesity, unspecified: Secondary | ICD-10-CM | POA: Diagnosis not present

## 2018-12-13 DIAGNOSIS — Z23 Encounter for immunization: Secondary | ICD-10-CM | POA: Diagnosis not present

## 2018-12-13 DIAGNOSIS — R7303 Prediabetes: Secondary | ICD-10-CM | POA: Diagnosis not present

## 2019-02-01 DIAGNOSIS — E782 Mixed hyperlipidemia: Secondary | ICD-10-CM | POA: Diagnosis not present

## 2019-02-01 DIAGNOSIS — Z0000 Encounter for general adult medical examination without abnormal findings: Secondary | ICD-10-CM | POA: Diagnosis not present

## 2019-02-01 DIAGNOSIS — Z6830 Body mass index (BMI) 30.0-30.9, adult: Secondary | ICD-10-CM | POA: Diagnosis not present

## 2019-02-01 DIAGNOSIS — R7303 Prediabetes: Secondary | ICD-10-CM | POA: Diagnosis not present

## 2019-02-01 DIAGNOSIS — Z681 Body mass index (BMI) 19 or less, adult: Secondary | ICD-10-CM | POA: Diagnosis not present

## 2019-02-01 DIAGNOSIS — R69 Illness, unspecified: Secondary | ICD-10-CM | POA: Diagnosis not present

## 2019-02-01 DIAGNOSIS — E669 Obesity, unspecified: Secondary | ICD-10-CM | POA: Diagnosis not present

## 2019-02-01 DIAGNOSIS — N390 Urinary tract infection, site not specified: Secondary | ICD-10-CM | POA: Diagnosis not present

## 2019-02-01 DIAGNOSIS — N3000 Acute cystitis without hematuria: Secondary | ICD-10-CM | POA: Diagnosis not present

## 2019-02-01 DIAGNOSIS — Z23 Encounter for immunization: Secondary | ICD-10-CM | POA: Diagnosis not present

## 2019-02-21 DIAGNOSIS — Z681 Body mass index (BMI) 19 or less, adult: Secondary | ICD-10-CM | POA: Diagnosis not present

## 2019-02-21 DIAGNOSIS — E782 Mixed hyperlipidemia: Secondary | ICD-10-CM | POA: Diagnosis not present

## 2019-02-21 DIAGNOSIS — Z23 Encounter for immunization: Secondary | ICD-10-CM | POA: Diagnosis not present

## 2019-02-21 DIAGNOSIS — E669 Obesity, unspecified: Secondary | ICD-10-CM | POA: Diagnosis not present

## 2019-02-21 DIAGNOSIS — F419 Anxiety disorder, unspecified: Secondary | ICD-10-CM | POA: Diagnosis not present

## 2019-02-21 DIAGNOSIS — R69 Illness, unspecified: Secondary | ICD-10-CM | POA: Diagnosis not present

## 2019-02-21 DIAGNOSIS — N390 Urinary tract infection, site not specified: Secondary | ICD-10-CM | POA: Diagnosis not present

## 2019-02-21 DIAGNOSIS — R296 Repeated falls: Secondary | ICD-10-CM | POA: Diagnosis not present

## 2019-02-21 DIAGNOSIS — R7303 Prediabetes: Secondary | ICD-10-CM | POA: Diagnosis not present

## 2019-02-21 DIAGNOSIS — G4700 Insomnia, unspecified: Secondary | ICD-10-CM | POA: Diagnosis not present

## 2019-02-21 DIAGNOSIS — N3000 Acute cystitis without hematuria: Secondary | ICD-10-CM | POA: Diagnosis not present

## 2019-02-21 DIAGNOSIS — Z0000 Encounter for general adult medical examination without abnormal findings: Secondary | ICD-10-CM | POA: Diagnosis not present

## 2019-02-21 DIAGNOSIS — Z6830 Body mass index (BMI) 30.0-30.9, adult: Secondary | ICD-10-CM | POA: Diagnosis not present

## 2019-03-10 ENCOUNTER — Inpatient Hospital Stay (HOSPITAL_COMMUNITY)
Admission: EM | Admit: 2019-03-10 | Discharge: 2019-03-15 | DRG: 057 | Disposition: A | Discharge status: Skilled Nursing Facility | Payer: Medicare HMO | Attending: Family Medicine | Admitting: Family Medicine

## 2019-03-10 ENCOUNTER — Encounter (HOSPITAL_COMMUNITY): Payer: Self-pay

## 2019-03-10 ENCOUNTER — Other Ambulatory Visit: Payer: Self-pay

## 2019-03-10 ENCOUNTER — Emergency Department (HOSPITAL_COMMUNITY): Payer: Medicare HMO

## 2019-03-10 DIAGNOSIS — Z79899 Other long term (current) drug therapy: Secondary | ICD-10-CM

## 2019-03-10 DIAGNOSIS — G912 (Idiopathic) normal pressure hydrocephalus: Secondary | ICD-10-CM | POA: Diagnosis not present

## 2019-03-10 DIAGNOSIS — R000 Tachycardia, unspecified: Secondary | ICD-10-CM | POA: Diagnosis not present

## 2019-03-10 DIAGNOSIS — F0390 Unspecified dementia without behavioral disturbance: Secondary | ICD-10-CM | POA: Diagnosis present

## 2019-03-10 DIAGNOSIS — W19XXXA Unspecified fall, initial encounter: Secondary | ICD-10-CM

## 2019-03-10 DIAGNOSIS — G4700 Insomnia, unspecified: Secondary | ICD-10-CM | POA: Diagnosis present

## 2019-03-10 DIAGNOSIS — Z03818 Encounter for observation for suspected exposure to other biological agents ruled out: Secondary | ICD-10-CM | POA: Diagnosis not present

## 2019-03-10 DIAGNOSIS — Z7982 Long term (current) use of aspirin: Secondary | ICD-10-CM

## 2019-03-10 DIAGNOSIS — Z955 Presence of coronary angioplasty implant and graft: Secondary | ICD-10-CM

## 2019-03-10 DIAGNOSIS — E785 Hyperlipidemia, unspecified: Secondary | ICD-10-CM | POA: Diagnosis present

## 2019-03-10 DIAGNOSIS — Z66 Do not resuscitate: Secondary | ICD-10-CM | POA: Diagnosis present

## 2019-03-10 DIAGNOSIS — R270 Ataxia, unspecified: Secondary | ICD-10-CM

## 2019-03-10 DIAGNOSIS — I2510 Atherosclerotic heart disease of native coronary artery without angina pectoris: Secondary | ICD-10-CM | POA: Diagnosis present

## 2019-03-10 DIAGNOSIS — Z823 Family history of stroke: Secondary | ICD-10-CM

## 2019-03-10 DIAGNOSIS — S0990XA Unspecified injury of head, initial encounter: Secondary | ICD-10-CM | POA: Diagnosis not present

## 2019-03-10 DIAGNOSIS — Z833 Family history of diabetes mellitus: Secondary | ICD-10-CM

## 2019-03-10 DIAGNOSIS — M25461 Effusion, right knee: Secondary | ICD-10-CM | POA: Diagnosis not present

## 2019-03-10 DIAGNOSIS — G9389 Other specified disorders of brain: Secondary | ICD-10-CM

## 2019-03-10 DIAGNOSIS — K219 Gastro-esophageal reflux disease without esophagitis: Secondary | ICD-10-CM | POA: Diagnosis present

## 2019-03-10 DIAGNOSIS — Y92009 Unspecified place in unspecified non-institutional (private) residence as the place of occurrence of the external cause: Secondary | ICD-10-CM

## 2019-03-10 DIAGNOSIS — Z8249 Family history of ischemic heart disease and other diseases of the circulatory system: Secondary | ICD-10-CM

## 2019-03-10 DIAGNOSIS — Z20828 Contact with and (suspected) exposure to other viral communicable diseases: Secondary | ICD-10-CM | POA: Diagnosis present

## 2019-03-10 DIAGNOSIS — S80211A Abrasion, right knee, initial encounter: Secondary | ICD-10-CM | POA: Diagnosis present

## 2019-03-10 DIAGNOSIS — R5381 Other malaise: Secondary | ICD-10-CM | POA: Diagnosis not present

## 2019-03-10 DIAGNOSIS — W010XXA Fall on same level from slipping, tripping and stumbling without subsequent striking against object, initial encounter: Secondary | ICD-10-CM | POA: Diagnosis present

## 2019-03-10 HISTORY — DX: Unspecified dementia, unspecified severity, without behavioral disturbance, psychotic disturbance, mood disturbance, and anxiety: F03.90

## 2019-03-10 LAB — CBC WITH DIFFERENTIAL/PLATELET
Abs Immature Granulocytes: 0.04 10*3/uL (ref 0.00–0.07)
Basophils Absolute: 0.1 10*3/uL (ref 0.0–0.1)
Basophils Relative: 1 %
Eosinophils Absolute: 0 10*3/uL (ref 0.0–0.5)
Eosinophils Relative: 0 %
HCT: 40.6 % (ref 36.0–46.0)
Hemoglobin: 12.7 g/dL (ref 12.0–15.0)
Immature Granulocytes: 0 %
Lymphocytes Relative: 14 %
Lymphs Abs: 1.4 10*3/uL (ref 0.7–4.0)
MCH: 27 pg (ref 26.0–34.0)
MCHC: 31.3 g/dL (ref 30.0–36.0)
MCV: 86.4 fL (ref 80.0–100.0)
Monocytes Absolute: 0.7 10*3/uL (ref 0.1–1.0)
Monocytes Relative: 7 %
Neutro Abs: 7.5 10*3/uL (ref 1.7–7.7)
Neutrophils Relative %: 78 %
Platelets: 296 10*3/uL (ref 150–400)
RBC: 4.7 MIL/uL (ref 3.87–5.11)
RDW: 14.2 % (ref 11.5–15.5)
WBC: 9.7 10*3/uL (ref 4.0–10.5)
nRBC: 0 % (ref 0.0–0.2)

## 2019-03-10 LAB — URINALYSIS, ROUTINE W REFLEX MICROSCOPIC
Bacteria, UA: NONE SEEN
Bilirubin Urine: NEGATIVE
Glucose, UA: NEGATIVE mg/dL
Ketones, ur: NEGATIVE mg/dL
Leukocytes,Ua: NEGATIVE
Nitrite: NEGATIVE
Protein, ur: NEGATIVE mg/dL
Specific Gravity, Urine: 1.014 (ref 1.005–1.030)
pH: 5 (ref 5.0–8.0)

## 2019-03-10 LAB — BASIC METABOLIC PANEL
Anion gap: 12 (ref 5–15)
BUN: 17 mg/dL (ref 8–23)
CO2: 25 mmol/L (ref 22–32)
Calcium: 10.1 mg/dL (ref 8.9–10.3)
Chloride: 105 mmol/L (ref 98–111)
Creatinine, Ser: 1.05 mg/dL — ABNORMAL HIGH (ref 0.44–1.00)
GFR calc Af Amer: >60 mL/min (ref >60)
GFR calc non Af Amer: 52 mL/min — ABNORMAL LOW (ref >60)
Glucose, Bld: 123 mg/dL — ABNORMAL HIGH (ref 70–99)
Potassium: 4.3 mmol/L (ref 3.5–5.1)
Sodium: 142 mmol/L (ref 135–145)

## 2019-03-10 NOTE — ED Notes (Signed)
Patient's brief changed. Patient back from x-ray.

## 2019-03-10 NOTE — ED Provider Notes (Signed)
[facility] EMERGENCY DEPARTMENT Provider Note   CSN: [id number] Arrival date & time: 03/10/19  1758     History   Chief Complaint Chief Complaint  Patient presents with  . Fall    HPI Katrina Gallegos is a 78 y.o. female with a history of dementia, GERD, dyslipidemia who lives at home with family, and typically ambulates using a walker, but per stepdaughter with whom she lives she has intermittent problems with falls, which has escalated over the past 2 days.  She states she tends to lean towards the left with ambulation and at times decides to sit when there is not a chair behind her and requires assistance from family with ambulation.  Today she had 2 falls, both involving forward fall landing on her knees.  She has complaint of pain in the right knee, however has been ambulatory since these falls.  She has had no head injuries with either incident.  She has had no new focal weakness, but did just complete a course of antibiotics for UTI.  She has had no fevers or chills, denies persistent dysuria, abdominal or back pain.  Has no other complaints of pain.  She has had no treatments prior to arrival.     The history is provided by the patient and a relative (POA stepdaughter [name]).    Past Medical History:  Diagnosis Date  . Chest pain   . Dementia (HCC)   . Dyslipidemia   . GERD (gastroesophageal reflux disease)   . Sinus tachycardia     Patient Active Problem List   Diagnosis Date Noted  . CAD 01/07/2009  . DYSLIPIDEMIA 12/31/2008  . GERD 12/31/2008    History reviewed. No pertinent surgical history.   OB History   No obstetric history on file.      Home Medications    Prior to Admission medications   Medication Sig Start Date End Date Taking? Authorizing Provider  aspirin EC 81 MG tablet Take 81 mg by mouth daily.    [provider]  calcium carbonate (TUMS - DOSED IN MG ELEMENTAL CALCIUM) 500 MG chewable tablet Chew 1 tablet by mouth daily as  needed for indigestion.     [provider]  cephALEXin (KEFLEX) 500 MG capsule Take 1 capsule (500 mg total) by mouth 4 (four) times daily. 09/27/17   Mesner, Barbara CowerJason, MD  fluticasone (FLONASE) 50 MCG/ACT nasal spray Place 2 sprays into both nostrils daily as needed for allergies or rhinitis.  08/30/17   Roe RutherfordKeatts, Courtney, NP  metoprolol (TOPROL-XL) 50 MG 24 hr tablet Take 50 mg by mouth daily.      [provider]  nitroGLYCERIN (NITROSTAT) 0.4 MG SL tablet Place 1 tablet (0.4 mg total) under the tongue every 5 (five) minutes as needed. Reported on 10/15/2015 10/15/15   Antoine PocheBranch, Jonathan F, MD    Family History No family history on file.  Social History Social History   Tobacco Use  . Smoking status: Never Smoker  . Smokeless tobacco: Never Used  Substance Use Topics  . Alcohol use: No    Alcohol/week: 0.0 standard drinks  . Drug use: No     Allergies   Patient has no known allergies.   Review of Systems Review of Systems  Constitutional: Negative for chills and fever.  HENT: Negative for congestion and sore throat.   Eyes: Negative.   Respiratory: Negative for shortness of breath.   Cardiovascular: Negative for chest pain.  Gastrointestinal: Negative for abdominal pain, nausea  and vomiting.  Genitourinary: Negative.  Negative for dysuria.  Musculoskeletal: Positive for arthralgias. Negative for joint swelling and neck pain.  Skin: Positive for wound. Negative for rash.  Neurological: Negative for dizziness, weakness, light-headedness, numbness and headaches.  Psychiatric/Behavioral: Negative.      Physical Exam Updated Vital Signs BP (!) 131/92 (BP Location: Right Arm)   Pulse 99   Temp 99.1 F (37.3 C) (Oral)   Resp 16   Ht 5\' 1"  (1.549 m)   Wt 72.6 kg   SpO2 95%   BMI 30.23 kg/m   Physical Exam Vitals signs and nursing note reviewed.  Constitutional:      Appearance: She is well-developed. She is not ill-appearing.     Comments: Pleasantly  confused  HENT:     Head: Normocephalic and atraumatic.  Eyes:     Extraocular Movements: Extraocular movements intact.     Conjunctiva/sclera: Conjunctivae normal.  Neck:     Musculoskeletal: Normal range of motion.  Cardiovascular:     Rate and Rhythm: Normal rate and regular rhythm.     Heart sounds: Normal heart sounds.  Pulmonary:     Effort: Pulmonary effort is normal.     Breath sounds: Normal breath sounds. No wheezing.  Abdominal:     General: Bowel sounds are normal.     Palpations: Abdomen is soft.     Tenderness: There is no abdominal tenderness.  Musculoskeletal: Normal range of motion.        General: Tenderness present. No swelling.     Comments: Tender to palpation right anterior patella.  There is a quarter sized superficial abrasion.  Skin:    General: Skin is warm and dry.     Findings: Abrasion present.  Neurological:     Mental Status: She is alert. She is disoriented.     Cranial Nerves: Cranial nerves are intact.     Sensory: Sensation is intact.     Gait: Gait abnormal.     Comments: Poor historian.  Unable to remember todays events well. Pt unable to ambulate, took 2 shuffling steps, then grabbed for nursing staff.   Psychiatric:        Behavior: Behavior is cooperative.      ED Treatments / Results  Labs (all labs ordered are listed, but only abnormal results are displayed) Labs Reviewed  BASIC METABOLIC PANEL - Abnormal; Notable for the following components:      Result Value   Glucose, Bld 123 (*)    Creatinine, Ser 1.05 (*)    GFR calc non Af Amer 52 (*)    All other components within normal limits  URINALYSIS, ROUTINE W REFLEX MICROSCOPIC - Abnormal; Notable for the following components:   Hgb urine dipstick SMALL (*)    All other components within normal limits  CBC WITH DIFFERENTIAL/PLATELET    EKG None  Radiology Ct Head Wo Contrast  Result Date: 03/11/2019 CLINICAL DATA:  Fall EXAM: CT HEAD WITHOUT CONTRAST TECHNIQUE:  Contiguous axial images were obtained from the base of the skull through the vertex without intravenous contrast. COMPARISON:  01/20/2017 FINDINGS: Brain: There is advanced generalized atrophy. No intracranial hemorrhage or other acute abnormality. There is periventricular hypoattenuation compatible with chronic microvascular disease. Vascular: No abnormal hyperdensity of the major intracranial arteries or dural venous sinuses. No intracranial atherosclerosis. Skull: The visualized skull base, calvarium and extracranial soft tissues are normal. Sinuses/Orbits: No fluid levels or advanced mucosal thickening of the visualized paranasal sinuses. No mastoid or middle ear effusion.  The orbits are normal. IMPRESSION: Advanced atrophy and chronic microvascular ischemia without acute intracranial abnormality. Electronically Signed   By: Ulyses Jarred M.D.   On: 03/11/2019 00:20   Dg Knee Complete 4 Views Right  Result Date: 03/10/2019 CLINICAL DATA:  Fall EXAM: RIGHT KNEE - COMPLETE 4+ VIEW COMPARISON:  None. FINDINGS: No acute fracture or traumatic malalignment. Joint spaces are largely maintained on these nonweightbearing images. Trace effusion is present. Small mineralization in the soft tissues of the posteromedial knee may reflect vascular calcification or remote posttraumatic mineralization. Soft tissues are otherwise unremarkable. IMPRESSION: 1. No acute osseous abnormality. Trace knee effusion. 2. Small mineralization in the posteromedial knee may reflect vascular calcification or remote posttraumatic mineralization. Electronically Signed   By: Lovena Le M.D.   On: 03/10/2019 20:44    Procedures Procedures (including critical care time)  Medications Ordered in ED Medications - No data to display   Initial Impression / Assessment and Plan / ED Course  I have reviewed the triage vital signs and the nursing notes.  Pertinent labs & imaging results that were available during my care of the patient  were reviewed by me and considered in my medical decision making (see chart for details).        Attempt to ambulate pt with assistance.  Pt was able to shuffle 1-2 steps, then flailed her arms and grabbed the RN.  Discussed further with Ms. Sprinkle, her POA (per phone) states she has had intermittent gait like since x 1+ month, seems worse since yesterday.   Unsafe with current mobility, will plan admission given ataxia, may need  MRI to r/o cerebellar source,although sx have been intermittent chronically and persistent since ytd -  CT negative for acute cva.  Need to consider PT eval, and/or rehab placement.     Discussed with Dr. Maudie Mercury who will see and admit.   Final Clinical Impressions(s) / ED Diagnoses   Final diagnoses:  Fall, initial encounter  [place]    ED Discharge Orders    None       Landis Martins 03/11/19 0053    Dorie Rank, MD 03/12/19 [id number]

## 2019-03-10 NOTE — ED Triage Notes (Addendum)
Pt brought to ED by [facility] after fall today at home. Pt states she fell at home twice today. Pt denies LOC or dizziness. Pt states she was trying to get up both times and fell and normally holds on to things as she ambulates but wasn't when she fell.  Pt denies hitting head. Pt with abrasion to right knee and c/o right knee pain.

## 2019-03-10 NOTE — ED Notes (Signed)
Pt's Step-daughter, Christean Leaf, and her POA called and left her contact numbers Cell [phone], home [phone].  Stated," the Pt has Dementia and may not be able to answer all questions"

## 2019-03-11 ENCOUNTER — Encounter (HOSPITAL_COMMUNITY): Payer: Self-pay | Admitting: Internal Medicine

## 2019-03-11 DIAGNOSIS — I2510 Atherosclerotic heart disease of native coronary artery without angina pectoris: Secondary | ICD-10-CM | POA: Diagnosis not present

## 2019-03-11 DIAGNOSIS — F0390 Unspecified dementia without behavioral disturbance: Secondary | ICD-10-CM | POA: Diagnosis not present

## 2019-03-11 DIAGNOSIS — Z8249 Family history of ischemic heart disease and other diseases of the circulatory system: Secondary | ICD-10-CM | POA: Diagnosis not present

## 2019-03-11 DIAGNOSIS — Z823 Family history of stroke: Secondary | ICD-10-CM | POA: Diagnosis not present

## 2019-03-11 DIAGNOSIS — R270 Ataxia, unspecified: Secondary | ICD-10-CM | POA: Diagnosis not present

## 2019-03-11 DIAGNOSIS — G912 (Idiopathic) normal pressure hydrocephalus: Secondary | ICD-10-CM | POA: Diagnosis not present

## 2019-03-11 DIAGNOSIS — K219 Gastro-esophageal reflux disease without esophagitis: Secondary | ICD-10-CM | POA: Diagnosis not present

## 2019-03-11 DIAGNOSIS — Z955 Presence of coronary angioplasty implant and graft: Secondary | ICD-10-CM | POA: Diagnosis not present

## 2019-03-11 DIAGNOSIS — Z833 Family history of diabetes mellitus: Secondary | ICD-10-CM | POA: Diagnosis not present

## 2019-03-11 DIAGNOSIS — Z79899 Other long term (current) drug therapy: Secondary | ICD-10-CM | POA: Diagnosis not present

## 2019-03-11 DIAGNOSIS — Z66 Do not resuscitate: Secondary | ICD-10-CM | POA: Diagnosis not present

## 2019-03-11 DIAGNOSIS — S80211A Abrasion, right knee, initial encounter: Secondary | ICD-10-CM | POA: Diagnosis not present

## 2019-03-11 DIAGNOSIS — G4700 Insomnia, unspecified: Secondary | ICD-10-CM | POA: Diagnosis not present

## 2019-03-11 DIAGNOSIS — W010XXA Fall on same level from slipping, tripping and stumbling without subsequent striking against object, initial encounter: Secondary | ICD-10-CM | POA: Diagnosis not present

## 2019-03-11 DIAGNOSIS — Z20828 Contact with and (suspected) exposure to other viral communicable diseases: Secondary | ICD-10-CM | POA: Diagnosis not present

## 2019-03-11 DIAGNOSIS — S0990XA Unspecified injury of head, initial encounter: Secondary | ICD-10-CM | POA: Diagnosis not present

## 2019-03-11 DIAGNOSIS — E785 Hyperlipidemia, unspecified: Secondary | ICD-10-CM | POA: Diagnosis not present

## 2019-03-11 DIAGNOSIS — G9389 Other specified disorders of brain: Secondary | ICD-10-CM | POA: Diagnosis not present

## 2019-03-11 DIAGNOSIS — R69 Illness, unspecified: Secondary | ICD-10-CM | POA: Diagnosis not present

## 2019-03-11 DIAGNOSIS — Z7982 Long term (current) use of aspirin: Secondary | ICD-10-CM | POA: Diagnosis not present

## 2019-03-11 LAB — COMPREHENSIVE METABOLIC PANEL
ALT: 20 U/L (ref 0–44)
AST: 23 U/L (ref 15–41)
Albumin: 4 g/dL (ref 3.5–5.0)
Alkaline Phosphatase: 62 U/L (ref 38–126)
Anion gap: 10 (ref 5–15)
BUN: 15 mg/dL (ref 8–23)
CO2: 24 mmol/L (ref 22–32)
Calcium: 9.4 mg/dL (ref 8.9–10.3)
Chloride: 108 mmol/L (ref 98–111)
Creatinine, Ser: 0.87 mg/dL (ref 0.44–1.00)
GFR calc Af Amer: >60 mL/min (ref >60)
GFR calc non Af Amer: >60 mL/min (ref >60)
Glucose, Bld: 105 mg/dL — ABNORMAL HIGH (ref 70–99)
Potassium: 3.4 mmol/L — ABNORMAL LOW (ref 3.5–5.1)
Sodium: 142 mmol/L (ref 135–145)
Total Bilirubin: 0.5 mg/dL (ref 0.3–1.2)
Total Protein: 7 g/dL (ref 6.5–8.1)

## 2019-03-11 LAB — SARS CORONAVIRUS 2 (TAT 6-24 HRS): SARS Coronavirus 2: NEGATIVE

## 2019-03-11 LAB — CBC
HCT: 37.9 % (ref 36.0–46.0)
Hemoglobin: 11.8 g/dL — ABNORMAL LOW (ref 12.0–15.0)
MCH: 26.8 pg (ref 26.0–34.0)
MCHC: 31.1 g/dL (ref 30.0–36.0)
MCV: 86.1 fL (ref 80.0–100.0)
Platelets: 255 10*3/uL (ref 150–400)
RBC: 4.4 MIL/uL (ref 3.87–5.11)
RDW: 14.1 % (ref 11.5–15.5)
WBC: 8.1 10*3/uL (ref 4.0–10.5)
nRBC: 0 % (ref 0.0–0.2)

## 2019-03-11 MED ORDER — SIMVASTATIN 20 MG PO TABS
20.0000 mg | ORAL_TABLET | Freq: Every day | ORAL | Status: DC
  Administered 2019-03-12 – 2019-03-14 (×3): 20 mg via ORAL
  Filled 2019-03-11 (×3): qty 1

## 2019-03-11 MED ORDER — SODIUM CHLORIDE 0.9 % IV SOLN: 250.0000 mL | INTRAVENOUS | Status: DC | PRN

## 2019-03-11 MED ORDER — LORAZEPAM 2 MG/ML IJ SOLN
0.5000 mg | Freq: Once | INTRAMUSCULAR | Status: AC
  Administered 2019-03-12: 0.5 mg via INTRAVENOUS
  Filled 2019-03-11: qty 1

## 2019-03-11 MED ORDER — CALCIUM CARBONATE ANTACID 500 MG PO CHEW: 1.0000 | CHEWABLE_TABLET | Freq: Every day | ORAL | Status: DC | PRN

## 2019-03-11 MED ORDER — SODIUM CHLORIDE 0.9% FLUSH: 3.0000 mL | INTRAVENOUS | Status: DC | PRN

## 2019-03-11 MED ORDER — SODIUM CHLORIDE 0.9 % IV SOLN
INTRAVENOUS | Status: AC
  Administered 2019-03-11: 20:00:00 via INTRAVENOUS
  Administered 2019-03-11: 75 mL/h via INTRAVENOUS

## 2019-03-11 MED ORDER — METOPROLOL SUCCINATE ER 25 MG PO TB24
50.0000 mg | ORAL_TABLET | Freq: Every day | ORAL | Status: DC
  Administered 2019-03-11 – 2019-03-15 (×5): 50 mg via ORAL
  Filled 2019-03-11 (×5): qty 2

## 2019-03-11 MED ORDER — ASPIRIN EC 81 MG PO TBEC
81.0000 mg | DELAYED_RELEASE_TABLET | Freq: Every day | ORAL | Status: DC
  Administered 2019-03-11 – 2019-03-15 (×5): 81 mg via ORAL
  Filled 2019-03-11 (×5): qty 1

## 2019-03-11 MED ORDER — POTASSIUM CHLORIDE CRYS ER 20 MEQ PO TBCR
40.0000 meq | EXTENDED_RELEASE_TABLET | ORAL | Status: AC
  Administered 2019-03-11 (×2): 40 meq via ORAL
  Filled 2019-03-11: qty 2

## 2019-03-11 MED ORDER — ACETAMINOPHEN 325 MG PO TABS: 650.0000 mg | ORAL_TABLET | Freq: Four times a day (QID) | ORAL | Status: DC | PRN

## 2019-03-11 MED ORDER — SODIUM CHLORIDE 0.9 % IV SOLN
INTRAVENOUS | Status: AC
  Administered 2019-03-11: 02:00:00 via INTRAVENOUS

## 2019-03-11 MED ORDER — ENOXAPARIN SODIUM 40 MG/0.4ML ~~LOC~~ SOLN
40.0000 mg | SUBCUTANEOUS | Status: DC
  Administered 2019-03-11 – 2019-03-15 (×5): 40 mg via SUBCUTANEOUS
  Filled 2019-03-11 (×5): qty 0.4

## 2019-03-11 MED ORDER — ACETAMINOPHEN 650 MG RE SUPP: 650.0000 mg | Freq: Four times a day (QID) | RECTAL | Status: DC | PRN

## 2019-03-11 MED ORDER — SODIUM CHLORIDE 0.9% FLUSH
3.0000 mL | Freq: Two times a day (BID) | INTRAVENOUS | Status: DC
Start: 2019-03-11 — End: 2019-03-15
  Administered 2019-03-11 – 2019-03-15 (×4): 3 mL via INTRAVENOUS

## 2019-03-11 MED ORDER — FLUTICASONE PROPIONATE 50 MCG/ACT NA SUSP: 2.0000 | Freq: Every day | NASAL | Status: DC | PRN

## 2019-03-11 NOTE — H&P (Addendum)
TRH H&P    Patient Demographics:    Katrina Gallegos, is a 78 y.o. female  MRN: [id number]  DOB - Oct 23, 1940  Admit Date - 03/10/2019  Referring MD/NP/PA: Evalee Jefferson  Outpatient Primary MD for the patient is Celene Squibb, MD  Patient coming from: home  Chief complaint- fall   HPI:    Katrina Gallegos  is a 78 y.o. female, w hyperlipidemia, cad, gerd,  dementia, apparently fell 03/07/2019, and today was coming down the hall, walking with the walker and fell down to floor  In am and afternoon. Scraped her right knee on the carpet during the afternoon fall.   Step-daughter concerned about patient may have had a stroke and brought her into ED.  Pt has not had slurred speech, vision change, cp, palp, sob, n/v, abd pain, diarrhea, brbpr, focal numbness, tingling, weakness.  + shuffling gait. Currently walks with walker.    In ED,  T 99.1,  P 99 R 16, Bp 131/92 pox 95% on RA Wt 72.6 kg  CT brain  IMPRESSION: Advanced atrophy and chronic microvascular ischemia without acute intracranial abnormality.  R knee IMPRESSION: 1. No acute osseous abnormality. Trace knee effusion. 2. Small mineralization in the posteromedial knee may reflect vascular calcification or remote posttraumatic mineralization.  Na 142, K 4.3 Bun 17, Creatinine 1.05 Wbc 9.7 Hgb 12.7 Plt 296  Urinalysis negative  Pt will be admitted for ataxia.      Review of systems:    In addition to the HPI above,  Per daughter Pt unable to cook, forgets what she is cooking Pt is unable to bathe herself.   Pt Is able to put on her own clothes but takes about 20 minutes. Sometimes gets frustrated putting her clothes on.  Pt has difficulty speaking in clear sentences.   Pt can't recall her own phone number, or location.   Pt unable to give clear history No Fever-chills, No Headache, No changes with Vision or hearing, No problems swallowing food or  Liquids, No Chest pain, Cough or Shortness of Breath, No Abdominal pain, No Nausea or Vomiting, bowel movements are regular, No Blood in stool or Urine, No dysuria, No new skin rashes or bruises, No new joints pains-aches,  No new weakness, tingling, numbness in any extremity, No recent weight gain or loss, No polyuria, polydypsia or polyphagia, No significant Mental Stressors.  All other systems reviewed and are negative.    Past History of the following :    Past Medical History:  Diagnosis Date  . Chest pain   . Dementia ([place])   . Dyslipidemia   . GERD (gastroesophageal reflux disease)   . Sinus tachycardia       Past Surgical History:  Procedure Laterality Date  . CORONARY ANGIOPLASTY WITH STENT PLACEMENT        Social History:      Social History   Tobacco Use  . Smoking status: Never Smoker  . Smokeless tobacco: Never Used  Substance Use Topics  . Alcohol use: No    Alcohol/week:  0.0 standard drinks       Family History :     Family History  Problem Relation Age of Onset  . Stroke Father   . Stroke Mother   . Diabetes Mother   . CAD Sister   . Diabetes Sister   . CAD Sister   . Diabetes Sister     pt unable to give history due to dementia   Home Medications:   Prior to Admission medications   Medication Sig Start Date End Date Taking? Authorizing Provider  aspirin EC 81 MG tablet Take 81 mg by mouth daily.    [provider]  calcium carbonate (TUMS - DOSED IN MG ELEMENTAL CALCIUM) 500 MG chewable tablet Chew 1 tablet by mouth daily as needed for indigestion.     [provider]  cephALEXin (KEFLEX) 500 MG capsule Take 1 capsule (500 mg total) by mouth 4 (four) times daily. 09/27/17   Mesner, Barbara Cower, MD  fluticasone (FLONASE) 50 MCG/ACT nasal spray Place 2 sprays into both nostrils daily as needed for allergies or rhinitis.  08/30/17   Roe Rutherford, NP  metoprolol (TOPROL-XL) 50 MG 24 hr tablet Take 50 mg by mouth daily.       [provider]  nitroGLYCERIN (NITROSTAT) 0.4 MG SL tablet Place 1 tablet (0.4 mg total) under the tongue every 5 (five) minutes as needed. Reported on 10/15/2015 10/15/15   Antoine Poche, MD     Allergies:    No Known Allergies   Physical Exam:   Vitals  Blood pressure 108/80, pulse 84, temperature 99.1 F (37.3 C), temperature source Oral, resp. rate 17, height  (1.549 m), weight 72.6 kg, SpO2 97 %.  1.  General: axox1   2. Psychiatric: euthymic  3. Neurologic: cn2-12 intact, reflexes 2+ symmetric, diffuse with no clonus, motor 5/5 in all 4 ext Poor finger to nose,  Tremor at full extension  4. HEENMT:  Anicteric, pupils 1.32mm symmetric, direct, consensual, near intact Neck: no jvd  5. Respiratory : CTAB  6. Cardiovascular : rrr s1, s2,   7. Gastrointestinal:  Abd: soft,  Nt, nd, +bs  8. Skin:  Ext: no c/c/e,  No rash  9.Musculoskeletal:  Good ROM    Data Review:    CBC Recent Labs  Lab 03/10/19 2046  WBC 9.7  HGB 12.7  HCT 40.6  PLT 296  MCV 86.4  MCH 27.0  MCHC 31.3  RDW 14.2  LYMPHSABS 1.4  MONOABS 0.7  EOSABS 0.0  BASOSABS 0.1   ------------------------------------------------------------------------------------------------------------------  Results for orders placed or performed during the hospital encounter of 03/10/19 (from the past 48 hour(s))  Basic metabolic panel     Status: Abnormal   Collection Time: 03/10/19  8:46 PM  Result Value Ref Range   Sodium 142 135 - 145 mmol/L   Potassium 4.3 3.5 - 5.1 mmol/L   Chloride 105 98 - 111 mmol/L   CO2 25 22 - 32 mmol/L   Glucose, Bld 123 (H) 70 - 99 mg/dL   BUN 17 8 - 23 mg/dL   Creatinine, Ser 1.61 (H) 0.44 - 1.00 mg/dL   Calcium 09.6 8.9 - 04.5 mg/dL   GFR calc non Af Amer 52 (L) >60 mL/min   GFR calc Af Amer >60 >60 mL/min   Anion gap 12 5 - 15    Comment: Performed at [facility], [address], [place], [place] [address]  CBC with Differential     Status:  None  Collection Time: 03/10/19  8:46 PM  Result Value Ref Range   WBC 9.7 4.0 - 10.5 K/uL   RBC 4.70 3.87 - 5.11 MIL/uL   Hemoglobin 12.7 12.0 - 15.0 g/dL   HCT 22.0 25.4 - 27.0 %   MCV 86.4 80.0 - 100.0 fL   MCH 27.0 26.0 - 34.0 pg   MCHC 31.3 30.0 - 36.0 g/dL   RDW 62.3 76.2 - 83.1 %   Platelets 296 150 - 400 K/uL   nRBC 0.0 0.0 - 0.2 %   Neutrophils Relative % 78 %   Neutro Abs 7.5 1.7 - 7.7 K/uL   Lymphocytes Relative 14 %   Lymphs Abs 1.4 0.7 - 4.0 K/uL   Monocytes Relative 7 %   Monocytes Absolute 0.7 0.1 - 1.0 K/uL   Eosinophils Relative 0 %   Eosinophils Absolute 0.0 0.0 - 0.5 K/uL   Basophils Relative 1 %   Basophils Absolute 0.1 0.0 - 0.1 K/uL   Immature Granulocytes 0 %   Abs Immature Granulocytes 0.04 0.00 - 0.07 K/uL    Comment: Performed at [facility], [address] [place], [place] [address]  Urinalysis, Routine w reflex microscopic     Status: Abnormal   Collection Time: 03/10/19 10:10 PM  Result Value Ref Range   Color, Urine YELLOW YELLOW   APPearance CLEAR CLEAR   Specific Gravity, Urine 1.014 1.005 - 1.030   pH 5.0 5.0 - 8.0   Glucose, UA NEGATIVE NEGATIVE mg/dL   Hgb urine dipstick SMALL (A) NEGATIVE   Bilirubin Urine NEGATIVE NEGATIVE   Ketones, ur NEGATIVE NEGATIVE mg/dL   Protein, ur NEGATIVE NEGATIVE mg/dL   Nitrite NEGATIVE NEGATIVE   Leukocytes,Ua NEGATIVE NEGATIVE   RBC / HPF 0-5 0 - 5 RBC/hpf   WBC, UA 0-5 0 - 5 WBC/hpf   Bacteria, UA NONE SEEN NONE SEEN   Squamous Epithelial / LPF 0-5 0 - 5   Mucus PRESENT     Comment: Performed at [facility], [address], [place], [place] [address]    Chemistries  Recent Labs  Lab 03/10/19 2046  NA 142  K 4.3  CL 105  CO2 25  GLUCOSE 123*  BUN 17  CREATININE 1.05*  CALCIUM 10.1   ------------------------------------------------------------------------------------------------------------------   ------------------------------------------------------------------------------------------------------------------ GFR: Estimated Creatinine Clearance: 42.2 mL/min (A) (by C-G formula based on SCr of 1.05 mg/dL (H)). Liver Function Tests: No results for input(s): AST, ALT, ALKPHOS, BILITOT, PROT, ALBUMIN in the last 168 hours. No results for input(s): LIPASE, AMYLASE in the last 168 hours. No results for input(s): AMMONIA in the last 168 hours. Coagulation Profile: No results for input(s): INR, PROTIME in the last 168 hours. Cardiac Enzymes: No results for input(s): CKTOTAL, CKMB, CKMBINDEX, TROPONINI in the last 168 hours. BNP (last 3 results) No results for input(s): PROBNP in the last 8760 hours. HbA1C: No results for input(s): HGBA1C in the last 72 hours. CBG: No results for input(s): GLUCAP in the last 168 hours. Lipid Profile: No results for input(s): CHOL, HDL, LDLCALC, TRIG, CHOLHDL, LDLDIRECT in the last 72 hours. Thyroid Function Tests: No results for input(s): TSH, T4TOTAL, FREET4, T3FREE, THYROIDAB in the last 72 hours. Anemia Panel: No results for input(s): VITAMINB12, FOLATE, FERRITIN, TIBC, IRON, RETICCTPCT in the last 72 hours.  --------------------------------------------------------------------------------------------------------------- Urine analysis:    Component Value Date/Time   COLORURINE YELLOW 03/10/2019 2210   APPEARANCEUR CLEAR 03/10/2019 2210   LABSPEC 1.014 03/10/2019 2210   PHURINE 5.0 03/10/2019 2210   GLUCOSEU  NEGATIVE 03/10/2019 2210   HGBUR SMALL (A) 03/10/2019 2210   BILIRUBINUR NEGATIVE 03/10/2019 2210   KETONESUR NEGATIVE 03/10/2019 2210   PROTEINUR NEGATIVE 03/10/2019 2210   NITRITE NEGATIVE 03/10/2019 2210   LEUKOCYTESUR NEGATIVE 03/10/2019 2210      Imaging Results:    Ct Head Wo Contrast  Result Date: 03/11/2019 CLINICAL DATA:  Fall EXAM: CT HEAD WITHOUT CONTRAST TECHNIQUE: Contiguous axial images were obtained from the base of  the skull through the vertex without intravenous contrast. COMPARISON:  01/20/2017 FINDINGS: Brain: There is advanced generalized atrophy. No intracranial hemorrhage or other acute abnormality. There is periventricular hypoattenuation compatible with chronic microvascular disease. Vascular: No abnormal hyperdensity of the major intracranial arteries or dural venous sinuses. No intracranial atherosclerosis. Skull: The visualized skull base, calvarium and extracranial soft tissues are normal. Sinuses/Orbits: No fluid levels or advanced mucosal thickening of the visualized paranasal sinuses. No mastoid or middle ear effusion. The orbits are normal. IMPRESSION: Advanced atrophy and chronic microvascular ischemia without acute intracranial abnormality. Electronically Signed   By: Deatra RobinsonKevin  Herman M.D.   On: 03/11/2019 00:20   Dg Knee Complete 4 Views Right  Result Date: 03/10/2019 CLINICAL DATA:  Fall EXAM: RIGHT KNEE - COMPLETE 4+ VIEW COMPARISON:  None. FINDINGS: No acute fracture or traumatic malalignment. Joint spaces are largely maintained on these nonweightbearing images. Trace effusion is present. Small mineralization in the soft tissues of the posteromedial knee may reflect vascular calcification or remote posttraumatic mineralization. Soft tissues are otherwise unremarkable. IMPRESSION: 1. No acute osseous abnormality. Trace knee effusion. 2. Small mineralization in the posteromedial knee may reflect vascular calcification or remote posttraumatic mineralization. Electronically Signed   By: Kreg ShropshirePrice  DeHay M.D.   On: 03/10/2019 20:44      Assessment & Plan:    Principal Problem:   Ataxia Active Problems:   Hyperlipemia  Ataxia MRI brain  PT evaluate and treat Social work for possible placement  CAD s/p stent, Hyperlipidemia Cont aspirin Cont Toprol Xl 50mg  po qday Cont Simvastatin 20mg  po qhs  Gerd Eats Tums at home  Insomnia/ Dementia Remeron 30mg  po qhs Trazodone 50mg  po qhs Risperdal  1mg  po qhs    DVT Prophylaxis-   Lovenox - SCDs   AM Labs Ordered, also please review Full Orders  Family Communication: Admission, patients condition and plan of care including tests being ordered have been discussed with the patient  who indicate understanding and agree with the plan and Code Status.  Code Status:  DNR  per patient and step-daughter Not[facility]  Admission status: Observation: Based on patients clinical presentation and evaluation of above clinical data, I have made determination that patient meets observation criteria at this time.    Time spent in minutes : 70    Pearson GrippeJames Zayla Agar M.D on 03/11/2019 at 1:50 AM

## 2019-03-11 NOTE — ED Notes (Signed)
Pt's daughter at bedside. Pt continues to remove her monitor although she knows she is in [place] and it is Saturday morning.

## 2019-03-11 NOTE — ED Notes (Signed)
Pt is confused and trying to get out of bed. Redirected to keep from getting up. Does verbalize she is in the hospital. Pt within view of nsg station.

## 2019-03-11 NOTE — ED Notes (Signed)
Pt offered lunch and declined. Says she does not wish to eat at this time.

## 2019-03-11 NOTE — Progress Notes (Signed)
   Patient seen and evaluated, chart reviewed, please see EMR for updated orders. Please see full H&P dictated by admitting physician Dr Maudie Mercury for same date of service.    78 y.o. female, w hyperlipidemia, HTN, CAD,  gerd and   dementia admitted from home on 03/11/2019 after a mechanical fall at home now presenting with very unsteady gait and ataxia  --Admitting physician requesting MRI brain and facial in-house neurology consult  -Admitting physician admitted patient to [facility]  due to lack of availability of MRI and in-house neurology over the weekend at [place]   --Patient remains confused, disoriented and unsteady  -Awaiting transfer to telemetry monitored bed at [facility] -Please get in-house neurology consult after MRI brain results are available at [facility], [place]

## 2019-03-11 NOTE — ED Notes (Signed)
Pt's Caregiver, [name] called for update and was given one. Per caregiver, pt has "sundowners and dementia" and "gets that way at night".

## 2019-03-11 NOTE — ED Notes (Signed)
Unable to keep monitor leads on. Pt very anxious and restless.

## 2019-03-12 ENCOUNTER — Observation Stay (HOSPITAL_COMMUNITY): Payer: Medicare HMO

## 2019-03-12 DIAGNOSIS — R410 Disorientation, unspecified: Secondary | ICD-10-CM | POA: Diagnosis not present

## 2019-03-12 DIAGNOSIS — Z833 Family history of diabetes mellitus: Secondary | ICD-10-CM | POA: Diagnosis not present

## 2019-03-12 DIAGNOSIS — R262 Difficulty in walking, not elsewhere classified: Secondary | ICD-10-CM | POA: Diagnosis not present

## 2019-03-12 DIAGNOSIS — Z8249 Family history of ischemic heart disease and other diseases of the circulatory system: Secondary | ICD-10-CM | POA: Diagnosis not present

## 2019-03-12 DIAGNOSIS — Z20828 Contact with and (suspected) exposure to other viral communicable diseases: Secondary | ICD-10-CM | POA: Diagnosis not present

## 2019-03-12 DIAGNOSIS — G9389 Other specified disorders of brain: Secondary | ICD-10-CM | POA: Diagnosis present

## 2019-03-12 DIAGNOSIS — G4700 Insomnia, unspecified: Secondary | ICD-10-CM | POA: Diagnosis present

## 2019-03-12 DIAGNOSIS — K219 Gastro-esophageal reflux disease without esophagitis: Secondary | ICD-10-CM | POA: Diagnosis not present

## 2019-03-12 DIAGNOSIS — S0990XA Unspecified injury of head, initial encounter: Secondary | ICD-10-CM | POA: Diagnosis not present

## 2019-03-12 DIAGNOSIS — E785 Hyperlipidemia, unspecified: Secondary | ICD-10-CM | POA: Diagnosis not present

## 2019-03-12 DIAGNOSIS — I2510 Atherosclerotic heart disease of native coronary artery without angina pectoris: Secondary | ICD-10-CM | POA: Diagnosis not present

## 2019-03-12 DIAGNOSIS — Z7982 Long term (current) use of aspirin: Secondary | ICD-10-CM | POA: Diagnosis not present

## 2019-03-12 DIAGNOSIS — M6281 Muscle weakness (generalized): Secondary | ICD-10-CM | POA: Diagnosis not present

## 2019-03-12 DIAGNOSIS — Z955 Presence of coronary angioplasty implant and graft: Secondary | ICD-10-CM | POA: Diagnosis not present

## 2019-03-12 DIAGNOSIS — R270 Ataxia, unspecified: Secondary | ICD-10-CM | POA: Diagnosis not present

## 2019-03-12 DIAGNOSIS — S80211A Abrasion, right knee, initial encounter: Secondary | ICD-10-CM | POA: Diagnosis present

## 2019-03-12 DIAGNOSIS — G912 (Idiopathic) normal pressure hydrocephalus: Secondary | ICD-10-CM | POA: Diagnosis not present

## 2019-03-12 DIAGNOSIS — F0390 Unspecified dementia without behavioral disturbance: Secondary | ICD-10-CM | POA: Diagnosis present

## 2019-03-12 DIAGNOSIS — Z66 Do not resuscitate: Secondary | ICD-10-CM | POA: Diagnosis not present

## 2019-03-12 DIAGNOSIS — R41841 Cognitive communication deficit: Secondary | ICD-10-CM | POA: Diagnosis not present

## 2019-03-12 DIAGNOSIS — Z79899 Other long term (current) drug therapy: Secondary | ICD-10-CM | POA: Diagnosis not present

## 2019-03-12 DIAGNOSIS — M2550 Pain in unspecified joint: Secondary | ICD-10-CM | POA: Diagnosis not present

## 2019-03-12 DIAGNOSIS — Z823 Family history of stroke: Secondary | ICD-10-CM | POA: Diagnosis not present

## 2019-03-12 DIAGNOSIS — W010XXA Fall on same level from slipping, tripping and stumbling without subsequent striking against object, initial encounter: Secondary | ICD-10-CM | POA: Diagnosis not present

## 2019-03-12 DIAGNOSIS — Z7401 Bed confinement status: Secondary | ICD-10-CM | POA: Diagnosis not present

## 2019-03-12 DIAGNOSIS — R296 Repeated falls: Secondary | ICD-10-CM | POA: Diagnosis not present

## 2019-03-12 DIAGNOSIS — R69 Illness, unspecified: Secondary | ICD-10-CM | POA: Diagnosis not present

## 2019-03-12 LAB — CBC
HCT: 37.5 % (ref 36.0–46.0)
Hemoglobin: 12.1 g/dL (ref 12.0–15.0)
MCH: 27.8 pg (ref 26.0–34.0)
MCHC: 32.3 g/dL (ref 30.0–36.0)
MCV: 86.2 fL (ref 80.0–100.0)
Platelets: 239 10*3/uL (ref 150–400)
RBC: 4.35 MIL/uL (ref 3.87–5.11)
RDW: 14.1 % (ref 11.5–15.5)
WBC: 7.2 10*3/uL (ref 4.0–10.5)
nRBC: 0 % (ref 0.0–0.2)

## 2019-03-12 LAB — BASIC METABOLIC PANEL
Anion gap: 9 (ref 5–15)
BUN: 10 mg/dL (ref 8–23)
CO2: 25 mmol/L (ref 22–32)
Calcium: 9.6 mg/dL (ref 8.9–10.3)
Chloride: 109 mmol/L (ref 98–111)
Creatinine, Ser: 0.92 mg/dL (ref 0.44–1.00)
GFR calc Af Amer: >60 mL/min (ref >60)
GFR calc non Af Amer: >60 mL/min (ref >60)
Glucose, Bld: 95 mg/dL (ref 70–99)
Potassium: 4.3 mmol/L (ref 3.5–5.1)
Sodium: 143 mmol/L (ref 135–145)

## 2019-03-12 MED ORDER — TRAZODONE HCL 50 MG PO TABS
50.0000 mg | ORAL_TABLET | Freq: Every day | ORAL | Status: DC
  Administered 2019-03-12 – 2019-03-14 (×3): 50 mg via ORAL
  Filled 2019-03-12 (×3): qty 1

## 2019-03-12 MED ORDER — MIRTAZAPINE 30 MG PO TBDP: 30.0000 mg | ORAL_TABLET | Freq: Every day | ORAL | Status: DC

## 2019-03-12 MED ORDER — RISPERIDONE 0.5 MG PO TABS
1.0000 mg | ORAL_TABLET | Freq: Every day | ORAL | Status: DC
  Administered 2019-03-12 – 2019-03-14 (×3): 1 mg via ORAL
  Filled 2019-03-12 (×3): qty 2

## 2019-03-12 MED ORDER — LORAZEPAM 2 MG/ML IJ SOLN
INTRAMUSCULAR | Status: AC
  Filled 2019-03-12: qty 1

## 2019-03-12 NOTE — Plan of Care (Signed)
  Problem: Nutrition: Goal: Adequate nutrition will be maintained Outcome: Progressing   

## 2019-03-12 NOTE — Evaluation (Signed)
Physical Therapy Evaluation Patient Details Name: Katrina Gallegos MRN: [id number] DOB: [birth year] Today's Date: 03/12/2019   History of Present Illness  78 y.o. female, w hyperlipidemia, cad, gerd,  dementia, apparently fell 03/07/2019, and 03/10/2019 was coming down the hall, walking with the walker and fell down to floor  Brought to [facility] ED where pt admitted for ataxia. CT scan reveals advanced atrophy and chronic microvascular ischemia without acute intracranial abnormality. R knee imaging revealed no acute abnormalities.  Transferred to [facility] for MRI 03/11/19  Clinical Impression  Pt eating breakfast on entry and agreeable to getting up with therapy. Pt only oriented to herself at time of evaluation. Pt reports that she lives by herself but that her step-daughter Pattie lives nearby and helps with bathing and getting groceries. Pt reports at least 4 falls in the last week. Pt is limited in safe mobility by decreased short term memory, (ie once she stands up with RW she forgets to use RW reaching around it to use sink for support to get to chair). Pt also is limited by decreased strength and balance. Pt currently requires min A for bed mobility, and transfer to RW and modA for ambulation 3 feet to recliner. Pt requires constant verbal and tactile cuing throughout mobility to maintain safety. PT recommends SNF level rehab at discharge. PT will continue to follow acutely.    Follow Up Recommendations SNF    Equipment Recommendations  Other (comment)(TBD at next venue)    Recommendations for Other Services OT consult     Precautions / Restrictions Precautions Precautions: Fall Precaution Comments: reports 4x fall in last week, probably more Restrictions Weight Bearing Restrictions: No      Mobility  Bed Mobility Overal bed mobility: Needs Assistance Bed Mobility: Supine to Sit     Supine to sit: Min assist     General bed mobility comments: pt with good initiation and then  becomes fearful and grabbing out at bedrail requiring min A for bringing trunk to upright, despite maximal cuing can not figure out how to scoot hips to EoB, requires min A for pad scoot of hips  Transfers Overall transfer level: Needs assistance Equipment used: Rolling walker (2 wheeled) Transfers: Sit to/from Stand Sit to Stand: Min assist         General transfer comment: despite maximal verbal and tactile cues pt unable to push up from bed surface and pulls up on RW, requires minA for steadying in standing  Ambulation/Gait Ambulation/Gait assistance: Mod assist Gait Distance (Feet): 3 Feet Assistive device: Rolling walker (2 wheeled) Gait Pattern/deviations: Step-to pattern;Decreased step length - right;Decreased step length - left;Shuffle;Wide base of support Gait velocity: slowed Gait velocity interpretation: <1.31 ft/sec, indicative of household ambulator General Gait Details: Pt able to repeat to therapist that she is going to sit in chair adjacent to bed. pt initiates ambulation with RW and then lets go and starts reaching for sink and chair, at which time RW becomes a safety hazard, unable to redirect pt back to RW, pt confused as to wear she is going and requiring modA for pivoting hips to chair.      Balance Overall balance assessment: Needs assistance Sitting-balance support: Feet supported;Feet unsupported;Bilateral upper extremity supported;Single extremity supported Sitting balance-Leahy Scale: Poor Sitting balance - Comments: requires assist to maintain balance, increased posterior lean due to anxiety about sitting up Postural control: Posterior lean Standing balance support: Bilateral upper extremity supported Standing balance-Leahy Scale: Poor Standing balance comment: requires outside support to  maintain balance.                              Pertinent Vitals/Pain      Home Living Family/patient expects to be discharged to:: Private  residence Living Arrangements: Alone Available Help at Discharge: Family;Available PRN/intermittently Type of Home: House Home Access: Ramped entrance     Home Layout: One level Home Equipment: Walker - 2 wheels;Tub bench      Prior Function Level of Independence: Needs assistance   Gait / Transfers Assistance Needed: uses RW, however has multiple falls  ADL's / Homemaking Assistance Needed: step daughter assists with bathing and iADLs, pt able to get dressed with increased time and effort           Extremity/Trunk Assessment   Upper Extremity Assessment Upper Extremity Assessment: Difficult to assess due to impaired cognition;Generalized weakness    Lower Extremity Assessment Lower Extremity Assessment: Difficult to assess due to impaired cognition;Generalized weakness;RLE deficits/detail;LLE deficits/detail RLE Deficits / Details: R knee abbrasion        Communication   Communication: Expressive difficulties(very repeatative, with stuttering )  Cognition Arousal/Alertness: Awake/alert Behavior During Therapy: Anxious;Restless;Impulsive Overall Cognitive Status: Impaired/Different from baseline Area of Impairment: Orientation;Attention;Memory;Following commands;Safety/judgement;Awareness;Problem solving                 Orientation Level: Disoriented to;Place;Time;Situation Current Attention Level: Selective Memory: Decreased short-term memory Following Commands: Follows one step commands inconsistently;Follows multi-step commands inconsistently;Follows one step commands with increased time Safety/Judgement: Decreased awareness of safety;Decreased awareness of deficits Awareness: Emergent Problem Solving: Slow processing;Decreased initiation;Difficulty sequencing;Requires verbal cues;Requires tactile cues General Comments: disoriented to situation, and level of deficit, increased anxiety with sitting EoB and with use of RW, initiates ambulation with RW and then  it is as if she forgets how to use it and reaches out for sink and chair, at which time RW becomes an obstecal       General Comments General comments (skin integrity, edema, etc.): Multiple abbrasions and bruises on extremities from falls     Assessment/Plan    PT Assessment Patient needs continued PT services  PT Problem List Decreased strength;Decreased activity tolerance;Decreased balance;Decreased mobility;Decreased coordination;Decreased cognition;Decreased safety awareness;Decreased knowledge of use of DME;Decreased knowledge of precautions       PT Treatment Interventions DME instruction;Gait training;Functional mobility training;Therapeutic activities;Therapeutic exercise;Balance training;Cognitive remediation;Patient/family education    PT Goals (Current goals can be found in the Care Plan section)  Acute Rehab PT Goals Patient Stated Goal: get stronger PT Goal Formulation: Patient unable to participate in goal setting Time For Goal Achievement: 03/26/19 Potential to Achieve Goals: Fair    Frequency Min 2X/week   Barriers to discharge Decreased caregiver support pt step daughter lives nearby but pt does not have 24 hr assist    Co-evaluation               AM-PAC PT "6 Clicks" Mobility  Outcome Measure Help needed turning from your back to your side while in a flat bed without using bedrails?: A Little Help needed moving from lying on your back to sitting on the side of a flat bed without using bedrails?: A Little Help needed moving to and from a bed to a chair (including a wheelchair)?: A Lot Help needed standing up from a chair using your arms (e.g., wheelchair or bedside chair)?: A Little Help needed to walk in hospital room?: A Lot Help needed climbing 3-5 steps with a  railing? : Total 6 Click Score: 14    End of Session Equipment Utilized During Treatment: Gait belt Activity Tolerance: Patient tolerated treatment well Patient left: in chair;with call  bell/phone within reach;with chair alarm set Nurse Communication: Mobility status;Precautions PT Visit Diagnosis: Unsteadiness on feet (R26.81);Other abnormalities of gait and mobility (R26.89);Muscle weakness (generalized) (M62.81);Difficulty in walking, not elsewhere classified (R26.2);Other symptoms and signs involving the nervous system (R29.898);History of falling (Z91.81);Repeated falls (R29.6)    Time: [id number] PT Time Calculation (min) (ACUTE ONLY): 33 min   Charges:   PT Evaluation $PT Eval Moderate Complexity: 1 Mod PT Treatments $Gait Training: 8-22 mins        Vondell Sowell B. Beverely RisenVan Fleet PT, DPT Acute Rehabilitation Services Pager [phone] Office [phone]   Elon Alaslizabeth B Van Fleet 03/12/2019, 9:53 AM

## 2019-03-12 NOTE — Progress Notes (Signed)
CSW acknowledges consult for SNF. PT/OT have been consulted to evaluate and make treatment recommendations.   CSW will continue to follow and assist with disposition planning based off therapy recommendations.   Domenic Schwab, MSW, [place] Worker [facility]  [phone]

## 2019-03-12 NOTE — TOC Initial Note (Signed)
Transition of Care [facility]) - Initial/Assessment Note    Patient Details  Name: Katrina Gallegos MRN: [id number] Date of Birth: Feb 28, 1941  [facility] CM/SW Contact:    Gelene Mink, [place] Phone Number: 03/12/2019, 3:02 PM  Clinical Narrative:                  CSW called and spoke with the patient's step-daughter/POA, [name]. CSW introduced herself and explained her role. CSW shared the therapy recommendation. [name] stated she is needing the patient to receive rehab before coming home. She stated that she lives behind her and is always assisting with taking care of her. She stated that she has been her medical and financial POA for about 4 years now. She stated that the patient started going down hill after her dad passed away last year. Her first two preferences are [place] stated that the patient has [employer] and it would limit the amount of facilities she would be accepted in. She also gave [facility] and [facility] in [place] as facility options. CSW gained permission to fax the patient out and provide bed offers.   CSW completed the patient's PASSR screening and it is under manual review. CSW met with Dr. Eliseo Squires and she signed the Dementia 30 Day note.   CSW will continue to follow and assist with discharge planning.   Expected Discharge Plan: Skilled Nursing Facility Barriers to Discharge: Insurance Authorization, Continued Medical Work up   Patient Goals and CMS Choice Patient states their goals for this hospitalization and ongoing recovery are:: Pt step-daughter agreeable to rehab CMS Medicare.gov Compare Post Acute Care list provided to:: Patient Represenative (must comment) Choice offered to / list presented to : Adult Children  Expected Discharge Plan and Services Expected Discharge Plan: [place] In-house Referral: Clinical Social Work Discharge Planning Services: NA Post Acute Care  Choice: [place] Living arrangements for the past 2 months: Single Family Home                 DME Arranged: N/A DME Agency: NA       HH Arranged: NA HH Agency: NA        Prior Living Arrangements/Services Living arrangements for the past 2 months: [place] Lives with:: Self(Step-daughter lives behind her and is always around) Patient language and need for interpreter reviewed:: No Do you feel safe going back to the place where you live?: No   Pt cannot return moment at this time  Need for Family Participation in Patient Care: Yes (Comment) Care giver support system in place?: Yes (comment)   Criminal Activity/Legal Involvement Pertinent to Current Situation/Hospitalization: No - Comment as needed  Activities of Daily Living      Permission Sought/Granted Permission sought to share information with : Case Manager Permission granted to share information with : Yes, Verbal Permission Granted  Share Information with NAME: [name]  Permission granted to share info w AGENCY: All SNF  Permission granted to share info w Relationship: Step-daughter/POA  Permission granted to share info w Contact Information: [phone]  Emotional Assessment Appearance:: Appears stated age Attitude/Demeanor/Rapport: Unable to Assess Affect (typically observed): Unable to Assess Orientation: : Oriented to Self Alcohol / Substance Use: Not Applicable Psych Involvement: No (comment)  Admission diagnosis:  Ataxia [R27.0] Fall, initial encounter [W19.XXXA] Patient Active Problem List   Diagnosis Date Noted  . 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 CAD 01/07/2009  . Hyperlipemia 12/31/2008  .  GERD 12/31/2008   PCP:  Celene Squibb, MD Pharmacy:   [facility], [address] [place] [address] Phone: [phone] Fax: [phone]     Social Determinants of Health (SDOH) Interventions    Readmission Risk Interventions No flowsheet  data found.

## 2019-03-12 NOTE — Progress Notes (Addendum)
Progress Note    Katrina Gallegos  QVZ:[id number] DOB: 1941/01/21  DOA: 03/10/2019 PCP: Benita Stabile, MD    Brief Narrative:     Medical records reviewed and are as summarized below:  Katrina Gallegos  is a 78 y.o. female, w hyperlipidemia, cad, gerd,  dementia, apparently fell 03/07/2019, and today was coming down the hall, walking with the walker and fell down to floor  x 2. Scraped her right knee on the carpet during the afternoon fall.   Step-daughter concerned about patient may have had a stroke and brought her into ED.  [facility]  Assessment/Plan:   Principal Problem:   Ataxia Active Problems:   Hyperlipemia   Ataxia MRI brain still pending PT- SNF  CAD s/p stent, Hyperlipidemia Cont aspirin Cont Toprol Xl 50mg  po qday Cont Simvastatin 20mg  po qhs  Gerd Eats Tums at home  Insomnia/ Dementia Trazodone 50mg  po qhs Risperdal 1mg  po qhs  lives alone, has fallen 4 times in the last week,  short-term memory impairment and unsteady, not safe for d/c home---- as will be exceeding 2 midnight, will change to inpatient   Addendum: have reviewed the CT and MRI-- ? 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 Code Status: Full Code.  Family Communication: family at bedside Disposition Plan: PT recommends SNF    Medical Consultants:    None.    Subjective:   No complaints  Objective:    Vitals:   03/11/19 2309 03/12/19 0319 03/12/19 0413 03/12/19 0825  BP: 139/74 138/83  (!) 144/77  Pulse: 83 (!) 104  82  Resp: 16 15  18   Temp: 98.1 F (36.7 C) 98.2 F (36.8 C)  98.8 F (37.1 C)  TempSrc: Oral Oral  Oral  SpO2: 96% 96%  98%  Weight:   71 kg   Height:        Intake/Output Summary (Last 24 hours) at 03/12/2019 1239 Last data filed at 03/11/2019 1345 Gross per 24 hour  Intake 346.25 ml   Output -  Net 346.25 ml   Filed Weights   03/10/19 1809 03/12/19 0413  Weight: 72.6 kg 71 kg    Exam: In bed, pleasantly confused -family at bedside provides most of the history rrr Poor dentition No increased work of breathing No focal deficit  Data Reviewed:   I have personally reviewed following labs and imaging studies:  Labs: Labs show the following:   Basic Metabolic Panel: Recent Labs  Lab 03/10/19 2046 03/11/19 0613 03/12/19 0809  NA 142 142 143  K 4.3 3.4* 4.3  CL 105 108 109  CO2 25 24 25   GLUCOSE 123* 105* 95  BUN 17 15 10   CREATININE 1.05* 0.87 0.92  CALCIUM 10.1 9.4 9.6   GFR Estimated Creatinine Clearance: 47.6 mL/min (by C-G formula based on SCr of 0.92 mg/dL). Liver Function Tests: Recent Labs  Lab 03/11/19 0613  AST 23  ALT 20  ALKPHOS 62  BILITOT 0.5  PROT 7.0  ALBUMIN 4.0   No results for input(s): LIPASE, AMYLASE in the last 168 hours. No results for input(s): AMMONIA in the last 168 hours. Coagulation profile No results for input(s): INR, PROTIME in the last 168 hours.  CBC: Recent Labs  Lab 03/10/19 2046 03/11/19 0613 03/12/19 0809  WBC 9.7 8.1 7.2  NEUTROABS  7.5  --   --   HGB 12.7 11.8* 12.1  HCT 40.6 37.9 37.5  MCV 86.4 86.1 86.2  PLT 296 255 239   Cardiac Enzymes: No results for input(s): CKTOTAL, CKMB, CKMBINDEX, TROPONINI in the last 168 hours. BNP (last 3 results) No results for input(s): PROBNP in the last 8760 hours. CBG: No results for input(s): GLUCAP in the last 168 hours. D-Dimer: No results for input(s): DDIMER in the last 72 hours. Hgb A1c: No results for input(s): HGBA1C in the last 72 hours. Lipid Profile: No results for input(s): CHOL, HDL, LDLCALC, TRIG, CHOLHDL, LDLDIRECT in the last 72 hours. Thyroid function studies: No results for input(s): TSH, T4TOTAL, T3FREE, THYROIDAB in the last 72 hours.  Invalid input(s): FREET3 Anemia work up: No results for input(s): VITAMINB12, FOLATE,  FERRITIN, TIBC, IRON, RETICCTPCT in the last 72 hours. Sepsis Labs: Recent Labs  Lab 03/10/19 2046 03/11/19 0613 03/12/19 0809  WBC 9.7 8.1 7.2    Microbiology Recent Results (from the past 240 hour(s))  SARS CORONAVIRUS 2 (TAT 6-24 HRS) Nasopharyngeal Nasopharyngeal Swab     Status: None   Collection Time: 03/11/19  1:17 AM   Specimen: Nasopharyngeal Swab  Result Value Ref Range Status   SARS Coronavirus 2 NEGATIVE NEGATIVE Final    Comment: (NOTE) SARS-CoV-2 target nucleic acids are NOT DETECTED. The SARS-CoV-2 RNA is generally detectable in upper and lower respiratory specimens during the acute phase of infection. Negative results do not preclude SARS-CoV-2 infection, do not rule out co-infections with other pathogens, and should not be used as the sole basis for treatment or other patient management decisions. Negative results must be combined with clinical observations, patient history, and epidemiological information. The expected result is Negative. Fact Sheet for Patients: SugarRoll.be Fact Sheet for Healthcare Providers: https://www.woods-mathews.com/ This test is not yet approved or cleared by the [place] FDA and  has been authorized for detection and/or diagnosis of SARS-CoV-2 by FDA under an Emergency Use Authorization (EUA). This EUA will remain  in effect (meaning this test can be used) for the duration of the COVID-19 declaration under Section 56 4(b)(1) of the Act, 21 U.S.C. section 360bbb-3(b)(1), unless the authorization is terminated or revoked sooner. Performed at [facility], [place] [address], [place],  [address]     Procedures and diagnostic studies:  Ct Head Wo Contrast  Result Date: 03/11/2019 CLINICAL DATA:  Fall EXAM: CT HEAD WITHOUT CONTRAST TECHNIQUE: Contiguous axial images were obtained from the base of the skull through the vertex without intravenous contrast. COMPARISON:   01/20/2017 FINDINGS: Brain: There is advanced generalized atrophy. No intracranial hemorrhage or other acute abnormality. There is periventricular hypoattenuation compatible with chronic microvascular disease. Vascular: No abnormal hyperdensity of the major intracranial arteries or dural venous sinuses. No intracranial atherosclerosis. Skull: The visualized skull base, calvarium and extracranial soft tissues are normal. Sinuses/Orbits: No fluid levels or advanced mucosal thickening of the visualized paranasal sinuses. No mastoid or middle ear effusion. The orbits are normal. IMPRESSION: Advanced atrophy and chronic microvascular ischemia without acute intracranial abnormality. Electronically Signed   By: Ulyses Jarred M.D.   On: 03/11/2019 00:20   Dg Knee Complete 4 Views Right  Result Date: 03/10/2019 CLINICAL DATA:  Fall EXAM: RIGHT KNEE - COMPLETE 4+ VIEW COMPARISON:  None. FINDINGS: No acute fracture or traumatic malalignment. Joint spaces are largely maintained on these nonweightbearing images. Trace effusion is present. Small mineralization in the soft tissues of the posteromedial knee may reflect vascular calcification  or remote posttraumatic mineralization. Soft tissues are otherwise unremarkable. IMPRESSION: 1. No acute osseous abnormality. Trace knee effusion. 2. Small mineralization in the posteromedial knee may reflect vascular calcification or remote posttraumatic mineralization. Electronically Signed   By: Kreg ShropshirePrice  DeHay M.D.   On: 03/10/2019 20:44    Medications:   . aspirin EC  81 mg Oral Daily  . enoxaparin (LOVENOX) injection  40 mg Subcutaneous Q24H  . LORazepam      . metoprolol succinate  50 mg Oral Daily  . simvastatin  20 mg Oral q1800  . sodium chloride flush  3 mL Intravenous Q12H   Continuous Infusions: . sodium chloride       LOS: 0 days   Joseph ArtJessica U Kiaria Quinnell  [facility]   How to contact the [facility] Attending or Consulting provider 7A - 7P or covering provider during  after hours 7P -7A, for this patient?  1. Check the care team in [facility] and look for a) attending/consulting TRH provider listed and b) the [facility] team listed 2. Log into [email or web address] and use [place]'s universal password to access. If you do not have the password, please contact the hospital operator. 3. Locate the [facility] provider you are looking for under [facility] and page to a number that you can be directly reached. 4. If you still have difficulty reaching the provider, please page the [facility] (Director on Call) for the Hospitalists listed on amion for assistance.  03/12/2019, 12:39 PM

## 2019-03-12 NOTE — [place] FL2 (Signed)
[place] MEDICAID FL2 LEVEL OF CARE SCREENING TOOL     IDENTIFICATION  Patient Name: Katrina Gallegos Birthdate: 13-Nov-1940 Sex: female Admission Date (Current Location): 03/10/2019  [facility] and [place] Number:  [employer] and Address:  [facility] [place], [place] [address] [place], [place] [address]      Provider Number: [id number]  Attending Physician Name and Address:  Geradine Girt, DO  Relative Name and Phone Number:  Christean Leaf, Step-daughter/POA, [phone]    Current Level of Care: SNF Recommended Level of Care: [place] Prior Approval Number:    Date Approved/Denied:   PASRR Number: Under Manual Review  Discharge Plan: SNF    Current Diagnoses: Patient Active Problem List   Diagnosis Date Noted  . 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 CAD 01/07/2009  . Hyperlipemia 12/31/2008  . GERD 12/31/2008    Orientation RESPIRATION BLADDER Height & Weight     Self  Normal Incontinent, External catheter Weight: 156 lb 8.4 oz (71 kg) Height:  5\' 1"  (154.9 cm)  BEHAVIORAL SYMPTOMS/MOOD NEUROLOGICAL BOWEL NUTRITION STATUS      Incontinent Diet(cardiac diet, thin liquids, Total assistance with eating)  AMBULATORY STATUS COMMUNICATION OF NEEDS Skin   Limited Assist Verbally Bruising, Skin abrasions(abrasion on right knee & bruising on breast/abdomen)                       Personal Care Assistance Level of Assistance  Bathing, Feeding, Dressing, Total care Bathing Assistance: Limited assistance Feeding assistance: Maximum assistance Dressing Assistance: Limited assistance Total Care Assistance: Limited assistance   Functional Limitations Info  Sight, Hearing, Speech Sight Info: Adequate Hearing Info: Adequate Speech Info: Adequate    SPECIAL CARE FACTORS FREQUENCY  PT (By licensed PT), OT (By licensed OT)     PT Frequency: 5x/wk OT Frequency: 5x/wk            Contractures Contractures Info: Not present     Additional Factors Info  Code Status, Allergies, Psychotropic Code Status Info: Full Code Allergies Info: No Known Allergies Psychotropic Info: Risperdal 1mg  daily at bedtime         Current Medications (03/12/2019):  This is the current hospital active medication list Current Facility-Administered Medications  Medication Dose Route Frequency Provider Last Rate Last Dose  . 0.9 %  sodium chloride infusion  250 mL Intravenous PRN Jani Gravel, MD      . acetaminophen (TYLENOL) tablet 650 mg  650 mg Oral Q6H PRN Jani Gravel, MD       Or  . acetaminophen (TYLENOL) suppository 650 mg  650 mg Rectal Q6H PRN Jani Gravel, MD      . aspirin EC tablet 81 mg  81 mg Oral Daily Jani Gravel, MD   81 mg at 03/12/19 0843  . calcium carbonate (TUMS - dosed in mg elemental calcium) chewable tablet 200 mg of elemental calcium  1 tablet Oral Daily PRN Jani Gravel, MD      . enoxaparin (LOVENOX) injection 40 mg  40 mg Subcutaneous Q24H Jani Gravel, MD   40 mg at 03/12/19 0844  . fluticasone (FLONASE) 50 MCG/ACT nasal spray 2 spray  2 spray Each Nare Daily PRN Jani Gravel, MD      . LORazepam (ATIVAN) 2 MG/ML injection           . metoprolol succinate (TOPROL-XL) 24 hr tablet 50 mg  50 mg Oral Daily Jani Gravel, MD   50 mg at 03/12/19 0843  .  risperiDONE (RISPERDAL) tablet 1 mg  1 mg Oral QHS Vann, Jessica U, DO      . simvastatin (ZOCOR) tablet 20 mg  20 mg Oral q1800 Pearson Grippe, MD      . sodium chloride flush (NS) 0.9 % injection 3 mL  3 mL Intravenous Q12H Pearson Grippe, MD   3 mL at 03/11/19 2200  . sodium chloride flush (NS) 0.9 % injection 3 mL  3 mL Intravenous PRN Pearson Grippe, MD      . traZODone (DESYREL) tablet 50 mg  50 mg Oral QHS Joseph Art, DO         Discharge Medications: Please see discharge summary for a list of discharge medications.  Relevant Imaging Results:  Relevant Lab Results:   Additional Information SSN: [id number]  Nada Boozer Collyn Selk, LCSWA

## 2019-03-12 NOTE — Progress Notes (Signed)
Pt continues to be confused and not following any safety measure. Pt refuses to keep on telemetry, pulled out 2nd iv. PT also refuses to keep on clothes. RN attempts to re-orient , but not successful. MD made aware.

## 2019-03-12 NOTE — Progress Notes (Signed)
CSW faxed clinicals to  [facility].   Domenic Schwab, MSW, [place] Worker [facility]  [phone]

## 2019-03-13 NOTE — Plan of Care (Addendum)
Patient stable, discussed POC with patient's step daughter [name], agreeable with plan. Currently reviewing SNFs at this time for disposition post D/C, denies question/concerns at this time.   Problem: Health Behavior/Discharge Planning: Goal: Ability to manage health-related needs will improve Outcome: Progressing   Problem: Nutrition: Goal: Adequate nutrition will be maintained Outcome: Progressing PO diet tol well.

## 2019-03-13 NOTE — Progress Notes (Signed)
Occupational Therapy Evaluation Patient Details Name: Katrina Gallegos MRN: [id number] DOB: February 04, 1941 Today's Date: 03/13/2019    History of Present Illness 78 y.o. female, w hyperlipidemia, cad, gerd,  dementia, apparently fell 03/07/2019, and 03/10/2019 was coming down the hall, walking with the walker and fell down to floor  Brought to [facility] ED where pt admitted for ataxia. CT scan reveals advanced atrophy and chronic microvascular ischemia without acute intracranial abnormality. R knee imaging revealed no acute abnormalities.  Transferred to [facility] for MRI 03/11/19   Clinical Impression   PTA, pt was living at home alone, and reports she was mostly independent with ADL/IADL and functional mobility at RW level. Pt reports family lives close by and assists prn. Pt currently requires minA for bed mobility and modA for LB ADL and functional mobility at RW level. Pt demonstrates increased fear of falling while sitting EOB and with mobility. Due to decline in current level of function, pt would benefit from acute OT to address established goals to facilitate safe D/C to venue listed below. At this time, recommend SNF follow-up. Will continue to follow acutely.     Follow Up Recommendations  SNF    Equipment Recommendations  3 in 1 bedside commode    Recommendations for Other Services       Precautions / Restrictions Precautions Precautions: Fall Precaution Comments: reports 4x fall in last week, probably more Restrictions Weight Bearing Restrictions: No      Mobility Bed Mobility Overal bed mobility: Needs Assistance Bed Mobility: Supine to Sit;Sit to Supine     Supine to sit: Min assist;HOB elevated Sit to supine: Min assist;HOB elevated   General bed mobility comments: pt with good initiation of progression;quickly becomes anxious and fearful of falling requiring vc for safe hand placement and to get feet on ground;minA for scooting hips to EOB  Transfers Overall  transfer level: Needs assistance Equipment used: Rolling walker (2 wheeled);1 person hand held assist Transfers: Sit to/from Stand Sit to Stand: Min assist;Mod assist         General transfer comment: minA for powerup;minA inititally progressing to modA for stability in standing;posterior lean    Balance Overall balance assessment: Needs assistance Sitting-balance support: Feet supported;Feet unsupported;Bilateral upper extremity supported;Single extremity supported Sitting balance-Leahy Scale: Poor Sitting balance - Comments: requires assist to maintain balance, increased posterior lean due to anxiety about sitting up Postural control: Posterior lean Standing balance support: Bilateral upper extremity supported Standing balance-Leahy Scale: Poor Standing balance comment: requires outside support to maintain balance.                            ADL either performed or assessed with clinical judgement   ADL Overall ADL's : Needs assistance/impaired Eating/Feeding: Set up;Sitting Eating/Feeding Details (indicate cue type and reason): vc to swallow pills in mouth, use of applesauce increased pt's ability to swallow Grooming: Minimal assistance;Sitting Grooming Details (indicate cue type and reason): sitting EOB Upper Body Bathing: Minimal assistance;Moderate assistance;Sitting   Lower Body Bathing: Minimal assistance;Sit to/from stand;Moderate assistance   Upper Body Dressing : Minimal assistance;Sitting   Lower Body Dressing: Minimal assistance;Moderate assistance;Sit to/from stand Lower Body Dressing Details (indicate cue type and reason): able to adjust socks sitting EOB;min-modA for stabiltiy Toilet Transfer: Moderate assistance;Stand-pivot Toilet Transfer Details (indicate cue type and reason): increased fear of falling;modA for stability with pivot Toileting- Clothing Manipulation and Hygiene: Moderate assistance;Sit to/from stand       Functional  mobility  during ADLs: Minimal assistance;Moderate assistance;Rolling walker General ADL Comments: decreased sitting balance, posterior lean;increased anxiety and fear of falling;     Vision Patient Visual Report: No change from baseline       Perception     Praxis      Pertinent Vitals/Pain Pain Assessment: Faces Faces Pain Scale: Hurts little more Pain Location: back with movement Pain Descriptors / Indicators: Grimacing Pain Intervention(s): Limited activity within patient's tolerance;Monitored during session     Hand Dominance Right   Extremity/Trunk Assessment Upper Extremity Assessment Upper Extremity Assessment: Generalized weakness   Lower Extremity Assessment Lower Extremity Assessment: Defer to PT evaluation RLE Deficits / Details: R knee abbrasion    Cervical / Trunk Assessment Cervical / Trunk Assessment: Normal   Communication Communication Communication: Expressive difficulties(very repeatative, with stuttering )   Cognition Arousal/Alertness: Awake/alert Behavior During Therapy: Anxious;Restless;Impulsive Overall Cognitive Status: Impaired/Different from baseline Area of Impairment: Attention;Memory;Following commands;Safety/judgement;Awareness;Problem solving                   Current Attention Level: Selective Memory: Decreased short-term memory Following Commands: Follows one step commands inconsistently;Follows multi-step commands inconsistently;Follows one step commands with increased time Safety/Judgement: Decreased awareness of safety;Decreased awareness of deficits Awareness: Emergent Problem Solving: Slow processing;Decreased initiation;Difficulty sequencing;Requires verbal cues;Requires tactile cues General Comments: increased anxiety sitting EOB and with standing, fear of falling;decreased safety awareness   General Comments  multiple abbrasions and bruises on extremities from falls    Exercises     Shoulder Instructions      Home  Living Family/patient expects to be discharged to:: Private residence Living Arrangements: Alone Available Help at Discharge: Family;Available PRN/intermittently Type of Home: House Home Access: Ramped entrance     Home Layout: One level     Bathroom Shower/Tub: Teacher, early years/pre: Standard     Home Equipment: Environmental consultant - 2 wheels;Tub bench          Prior Functioning/Environment Level of Independence: Needs assistance  Gait / Transfers Assistance Needed: uses RW, however has multiple falls ADL's / Homemaking Assistance Needed: step daughter assists with bathing and iADLs, pt able to get dressed with increased time and effort            OT Problem List: Impaired balance (sitting and/or standing);Decreased activity tolerance;Decreased cognition;Decreased safety awareness;Decreased knowledge of precautions;Decreased knowledge of use of DME or AE;Pain      OT Treatment/Interventions: Therapeutic exercise;Self-care/ADL training;DME and/or AE instruction;Patient/family education;Balance training;Therapeutic activities;Cognitive remediation/compensation    OT Goals(Current goals can be found in the care plan section) Acute Rehab OT Goals Patient Stated Goal: get stronger OT Goal Formulation: With patient Time For Goal Achievement: 03/27/19 Potential to Achieve Goals: Good ADL Goals Pt Will Perform Grooming: with modified independence;standing;sitting Pt Will Perform Upper Body Dressing: with modified independence;sitting;standing Pt Will Perform Lower Body Dressing: with modified independence;sit to/from stand Pt Will Transfer to Toilet: with modified independence;ambulating Pt Will Perform Toileting - Clothing Manipulation and hygiene: with modified independence;sit to/from stand  OT Frequency: Min 2X/week   Barriers to D/C: Decreased caregiver support  pt lives alone       Co-evaluation              AM-PAC OT "6 Clicks" Daily Activity     Outcome  Measure Help from another person eating meals?: A Little Help from another person taking care of personal grooming?: A Little Help from another person toileting, which includes using toliet, bedpan, or urinal?: A Lot Help  from another person bathing (including washing, rinsing, drying)?: A Lot Help from another person to put on and taking off regular upper body clothing?: A Little Help from another person to put on and taking off regular lower body clothing?: A Lot 6 Click Score: 15   End of Session Equipment Utilized During Treatment: Gait belt;Rolling walker Nurse Communication: Mobility status  Activity Tolerance: Patient tolerated treatment well Patient left: in bed;with call bell/phone within reach;with bed alarm set  OT Visit Diagnosis: Unsteadiness on feet (R26.81);Other abnormalities of gait and mobility (R26.89);Muscle weakness (generalized) (M62.81);Other symptoms and signs involving cognitive function;Pain Pain - part of body: (back)                Time: [id number] OT Time Calculation (min): 25 min Charges:  OT General Charges $OT Visit: 1 Visit OT Evaluation $OT Eval Moderate Complexity: 1 Mod  Diona Brownereresa Angeligue Bowne OTR/L Acute Rehabilitation Services Office: [phone]   Rebeca Alerteresa J Hephzibah Strehle 03/13/2019, 11:24 AM

## 2019-03-13 NOTE — TOC Progression Note (Addendum)
Transition of Care [facility]) - Progression Note    Patient Details  Name: Katrina Gallegos MRN: [id number] Date of Birth: 01-Jun-1941  Transition of Care [facility]) CM/SW [place], [place] Phone Number: 03/13/2019, 2:41 PM  Clinical Narrative:   CSW reached out to [facility] and [facility] to ask about bed availability. Awaiting call back from [facility], but [facility] will be able to take the patient. CSW talked with patient's daughter-in-law, [name], to discuss bed offer at [place] agrees. [place] will start insurance authorization with [employer]. [name] asked about clothes for the patient's SNF stay, and CSW discussed things to pack for the patient. CSW to continue to follow.  UPDATE 3:44 PM: CSW reviewed [facility] for PASRR request, [facility] requested additional information. CSW obtained information, scanned, and uploaded to [facility] for request.     Expected Discharge Plan: Skilled Nursing Facility Barriers to Discharge: Ship broker, Continued Medical Work up  Expected Discharge Plan and Services Expected Discharge Plan: [place] In-house Referral: Clinical Social Work Discharge Planning Services: NA Post Acute Care Choice: [place] Living arrangements for the past 2 months: Single Family Home                 DME Arranged: N/A DME Agency: NA       HH Arranged: NA HH Agency: NA         Social Determinants of Health (SDOH) Interventions    Readmission Risk Interventions No flowsheet data found.

## 2019-03-13 NOTE — Progress Notes (Signed)
Progress Note    Katrina BuddsMary C Gallegos  ZOX:[id number] DOB: 02/15/1941  DOA: 03/10/2019 PCP: Benita StabileHall, John Z, MD    Brief Narrative:     Medical records reviewed and are as summarized below:  Katrina EllisMary Gallegos  is a 78 y.o. female, w hyperlipidemia, cad, gerd,  dementia, apparently fell 03/07/2019, and today was coming down the hall, walking with the walker and fell down to floor  x 2. Scraped her right knee on the carpet during the afternoon fall.   Step-daughter concerned about patient may have had a stroke and brought her into ED.  [facility]  Assessment/Plan:   Principal Problem:   Ataxia Active Problems:   Hyperlipemia   Ataxia MRI brain negative for CVA: but shows ventriculomegaly PT- SNF ? [facility] (patient is certainly has 2 of the 3 Ws)-- will need outpatient NS follow up for possible shunt  CAD s/p stent, Hyperlipidemia Cont aspirin Cont Toprol Xl 50mg  po qday Cont Simvastatin 20mg  po qhs  Gerd Eats Tums at home  Insomnia/ Dementia Trazodone 50mg  po qhs Risperdal 1mg  po qhs  lives alone, has fallen 4 times in the last week,  Continues with short-term memory impairment and unsteadiness, not safe for d/c home- await SNF    Family Communication/Anticipated D/C date and plan/Code Status   DVT prophylaxis: Lovenox ordered. Code Status: Full Code.  Family Communication: called step-daughter Disposition Plan: PT recommends SNF    Medical Consultants:    None.    Subjective:   I thought I was at home last night  Objective:    Vitals:   03/12/19 2357 03/13/19 0410 03/13/19 0500 03/13/19 0727  BP: (!) 142/74 (!) 145/75  112/80  Pulse: (!) 106 (!) 104  93  Resp: 15 15  16   Temp: 98.6 F (37 C) 98.1 F (36.7 C)  97.8 F (36.6 C)  TempSrc: Oral Oral  Oral  SpO2: 93% 97%  100%  Weight:   73 kg   Height:        Intake/Output Summary (Last 24 hours) at 03/13/2019 1023 Last data filed at 03/12/2019 1800 Gross per 24 hour  Intake 240 ml  Output -   Net 240 ml   Filed Weights   03/12/19 0413 03/12/19 2002 03/13/19 0500  Weight: 71 kg 71 kg 73 kg    Exam: In bed, eating breakfast pleasantly confused NAD rrr No increased work of breathing  Data Reviewed:   I have personally reviewed following labs and imaging studies:  Labs: Labs show the following:   Basic Metabolic Panel: Recent Labs  Lab 03/10/19 2046 03/11/19 0613 03/12/19 0809  NA 142 142 143  K 4.3 3.4* 4.3  CL 105 108 109  CO2 25 24 25   GLUCOSE 123* 105* 95  BUN 17 15 10   CREATININE 1.05* 0.87 0.92  CALCIUM 10.1 9.4 9.6   GFR Estimated Creatinine Clearance: 48.3 mL/min (by C-G formula based on SCr of 0.92 mg/dL). Liver Function Tests: Recent Labs  Lab 03/11/19 0613  AST 23  ALT 20  ALKPHOS 62  BILITOT 0.5  PROT 7.0  ALBUMIN 4.0   No results for input(s): LIPASE, AMYLASE in the last 168 hours. No results for input(s): AMMONIA in the last 168 hours. Coagulation profile No results for input(s): INR, PROTIME in the last 168 hours.  CBC: Recent Labs  Lab 03/10/19 2046 03/11/19 0613 03/12/19 0809  WBC 9.7 8.1 7.2  NEUTROABS 7.5  --   --  HGB 12.7 11.8* 12.1  HCT 40.6 37.9 37.5  MCV 86.4 86.1 86.2  PLT 296 255 239   Cardiac Enzymes: No results for input(s): CKTOTAL, CKMB, CKMBINDEX, TROPONINI in the last 168 hours. BNP (last 3 results) No results for input(s): PROBNP in the last 8760 hours. CBG: No results for input(s): GLUCAP in the last 168 hours. D-Dimer: No results for input(s): DDIMER in the last 72 hours. Hgb A1c: No results for input(s): HGBA1C in the last 72 hours. Lipid Profile: No results for input(s): CHOL, HDL, LDLCALC, TRIG, CHOLHDL, LDLDIRECT in the last 72 hours. Thyroid function studies: No results for input(s): TSH, T4TOTAL, T3FREE, THYROIDAB in the last 72 hours.  Invalid input(s): FREET3 Anemia work up: No results for input(s): VITAMINB12, FOLATE, FERRITIN, TIBC, IRON, RETICCTPCT in the last 72 hours. Sepsis  Labs: Recent Labs  Lab 03/10/19 2046 03/11/19 0613 03/12/19 0809  WBC 9.7 8.1 7.2    Microbiology Recent Results (from the past 240 hour(s))  SARS CORONAVIRUS 2 (TAT 6-24 HRS) Nasopharyngeal Nasopharyngeal Swab     Status: None   Collection Time: 03/11/19  1:17 AM   Specimen: Nasopharyngeal Swab  Result Value Ref Range Status   SARS Coronavirus 2 NEGATIVE NEGATIVE Final    Comment: (NOTE) SARS-CoV-2 target nucleic acids are NOT DETECTED. The SARS-CoV-2 RNA is generally detectable in upper and lower respiratory specimens during the acute phase of infection. Negative results do not preclude SARS-CoV-2 infection, do not rule out co-infections with other pathogens, and should not be used as the sole basis for treatment or other patient management decisions. Negative results must be combined with clinical observations, patient history, and epidemiological information. The expected result is Negative. Fact Sheet for Patients: HairSlick.no Fact Sheet for Healthcare Providers: quierodirigir.com This test is not yet approved or cleared by the [place] FDA and  has been authorized for detection and/or diagnosis of SARS-CoV-2 by FDA under an Emergency Use Authorization (EUA). This EUA will remain  in effect (meaning this test can be used) for the duration of the COVID-19 declaration under Section 56 4(b)(1) of the Act, 21 U.S.C. section 360bbb-3(b)(1), unless the authorization is terminated or revoked sooner. Performed at [facility], [address], [place], [place] [address]     Procedures and diagnostic studies:  Mr Brain Wo Contrast  Result Date: 03/12/2019 CLINICAL DATA:  Ataxia.  Fall. EXAM: MRI HEAD WITHOUT CONTRAST TECHNIQUE: Multiplanar, multiecho pulse sequences of the brain and surrounding structures were obtained without intravenous contrast. COMPARISON:  Head CT 03/11/2019 FINDINGS: The patient was  unable to tolerate the examination which was terminated prior to completion. Axial and coronal diffusion and axial FLAIR sequences were obtained with the latter being moderately to severely motion degraded. Diffusion imaging is of diagnostic quality, and no acute infarct is identified. There is prominent chronic lateral and third ventriculomegaly with sulcal crowding at the vertex. Patchy to confluent T2 hyperintensities in the cerebral white matter bilaterally are nonspecific but compatible with moderate chronic small vessel ischemic disease, no gross intracranial hemorrhage, intracranial mass effect, or extra-axial fluid collection is identified. IMPRESSION: 1. Motion degraded, incomplete examination. 2. No acute infarct. 3. 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 4. Moderate chronic small vessel ischemic disease. Electronically Signed   By: Sebastian Ache M.D.   On: 03/12/2019 13:55    Medications:   . aspirin EC  81 mg Oral Daily  . enoxaparin (LOVENOX) injection  40 mg Subcutaneous Q24H  .  metoprolol succinate  50 mg Oral Daily  . risperiDONE  1 mg Oral QHS  . simvastatin  20 mg Oral q1800  . sodium chloride flush  3 mL Intravenous Q12H  . traZODone  50 mg Oral QHS   Continuous Infusions: . sodium chloride       LOS: 1 day   Geradine Girt  [facility]   How to contact the [facility] Attending or Consulting provider [place] or covering provider during after hours [place], for this patient?  1. Check the care team in [facility] and look for a) attending/consulting TRH provider listed and b) the [facility] team listed 2. Log into [email or web address] and use 's universal password to access. If you do not have the password, please contact the hospital operator. 3. Locate the [facility] provider you are looking for under [facility] and page to a number that you can be directly reached. 4. If you still have difficulty reaching the  provider, please page the [facility] (Director on Call) for the Hospitalists listed on amion for assistance.  03/13/2019, 10:23 AM

## 2019-03-14 NOTE — Progress Notes (Signed)
Physical Therapy Treatment Patient Details Name: Katrina Gallegos MRN: [id number] DOB: Jun 07, 1940 Today's Date: 03/14/2019    History of Present Illness 78 y.o. female, w hyperlipidemia, cad, gerd,  dementia, apparently fell 03/07/2019, and 03/10/2019 was coming down the hall, walking with the walker and fell down to floor  Brought to [facility] ED where pt admitted for ataxia. CT scan reveals advanced atrophy and chronic microvascular ischemia without acute intracranial abnormality. R knee imaging revealed no acute abnormalities.  Transferred to [facility] for MRI 03/11/19    PT Comments    Pt performed gt training to and from bathroom.  Her ability to follow commands due to anxiety and fear of falling is very poor.  She is very distracted even in a quiet room.  Pt utilized RW poorly and constantly removed hands to pull on shelves, curtains, door frames and linen hanging from back of door.  She pushed RW too far out in front and when cued to correct she became increasingly unsteady.  Her dementia limits her progress.  She continues to benefit from SNF placement to improve strength and function before returning home.      Follow Up Recommendations  SNF     Equipment Recommendations  Other (comment)(TBD at next venue)    Recommendations for Other Services       Precautions / Restrictions Precautions Precautions: Fall Precaution Comments: reports 4x fall in last week, probably more Restrictions Weight Bearing Restrictions: No    Mobility  Bed Mobility Overal bed mobility: Needs Assistance Bed Mobility: Supine to Sit;Rolling;Sidelying to Sit Rolling: Min assist Sidelying to sit: Mod assist       General bed mobility comments: Pt required cues to roll and reach for railing.  She began to reach for PTA to come to sitting edge of bed.  Pt presents with poor core strength and required moderate assistance to move into a seated position.  Once sitting assisted boosting to edge of bed to  ground B feet.  Multiple LOB noted posterior in sitting.  Pt's righting response is very delayed.  Transfers Overall transfer level: Needs assistance Equipment used: Rolling walker (2 wheeled) Transfers: Sit to/from Stand Sit to Stand: Min assist         General transfer comment: Cues for hand placement to and from seated surface into standing.  Pt is very fearful of falling and at times grabs to rails and different thigs around her despite cues to keep hand on RW hand grips in standing.  Ambulation/Gait Ambulation/Gait assistance: Mod assist Gait Distance (Feet): 10 Feet(x2) Assistive device: Rolling walker (2 wheeled) Gait Pattern/deviations: Step-to pattern;Decreased step length - right;Decreased step length - left;Shuffle;Wide base of support;Trunk flexed     General Gait Details: Pt very unsteady and pushed RW way to far when attempting to correct RW position she becomes increasingly anxious and throws her weight backkwards.  She continues to be distracted and intermittently following commands.  She is reaching for counters, curtains and anything she can get her hands on.  She would benefit from +2 for close chair follow as she is rather unpredictable.   Stairs             Wheelchair Mobility    Modified Rankin (Stroke Patients Only)       Balance Overall balance assessment: Needs assistance   Sitting balance-Leahy Scale: Poor       Standing balance-Leahy Scale: Poor Standing balance comment: requires outside support to maintain balance.  Cognition Arousal/Alertness: Awake/alert Behavior During Therapy: Anxious;Restless;Impulsive Overall Cognitive Status: Impaired/Different from baseline Area of Impairment: Attention;Memory;Following commands;Safety/judgement;Awareness;Problem solving                 Orientation Level: Disoriented to;Place;Time;Situation Current Attention Level: Selective Memory: Decreased  short-term memory Following Commands: Follows one step commands inconsistently;Follows multi-step commands inconsistently;Follows one step commands with increased time Safety/Judgement: Decreased awareness of safety;Decreased awareness of deficits Awareness: Emergent Problem Solving: Slow processing;Decreased initiation;Difficulty sequencing;Requires verbal cues;Requires tactile cues General Comments: increased anxiety sitting EOB and with standing, fear of falling;decreased safety awareness continues.      Exercises      General Comments        Pertinent Vitals/Pain Pain Assessment: Faces Faces Pain Scale: Hurts little more Pain Location: back with movement Pain Descriptors / Indicators: Grimacing Pain Intervention(s): Monitored during session;Repositioned    Home Living                      Prior Function            PT Goals (current goals can now be found in the care plan section) Acute Rehab PT Goals Patient Stated Goal: get stronger PT Goal Formulation: Patient unable to participate in goal setting Potential to Achieve Goals: Fair Progress towards PT goals: Progressing toward goals    Frequency    Min 2X/week      PT Plan Current plan remains appropriate    Co-evaluation              AM-PAC PT "6 Clicks" Mobility   Outcome Measure  Help needed turning from your back to your side while in a flat bed without using bedrails?: A Little Help needed moving from lying on your back to sitting on the side of a flat bed without using bedrails?: A Little Help needed moving to and from a bed to a chair (including a wheelchair)?: A Lot Help needed standing up from a chair using your arms (e.g., wheelchair or bedside chair)?: A Lot Help needed to walk in hospital room?: A Lot Help needed climbing 3-5 steps with a railing? : Total 6 Click Score: 13    End of Session Equipment Utilized During Treatment: Gait belt Activity Tolerance: Patient tolerated  treatment well Patient left: in chair;with call bell/phone within reach;with chair alarm set Nurse Communication: Mobility status;Precautions PT Visit Diagnosis: Unsteadiness on feet (R26.81);Other abnormalities of gait and mobility (R26.89);Muscle weakness (generalized) (M62.81);Difficulty in walking, not elsewhere classified (R26.2);Other symptoms and signs involving the nervous system (R29.898);History of falling (Z91.81);Repeated falls (R29.6)     Time: 9983-3825 PT Time Calculation (min) (ACUTE ONLY): 32 min  Charges:  $Gait Training: 8-22 mins $Therapeutic Activity: 8-22 mins                     Governor Rooks, PTA Acute Rehabilitation Services Pager [phone] Office [phone]     Takera Rayl Eli Hose 03/14/2019, 2:16 PM

## 2019-03-14 NOTE — Progress Notes (Signed)
PROGRESS NOTE    Katrina Gallegos  IWP:[id number] DOB: September 21, 1940 DOA: 03/10/2019 PCP: Benita Stabile, MD   Brief Narrative:  Katrina Gallegos  is a 78 y.o. female, w hyperlipidemia, cad, gerd,  dementia, apparently fell 03/07/2019, and and on 03/11/2019 when she was coming down the hall, walking with the walker and fell down to floor, and fell twice, In am and afternoon. Scraped her right knee on the carpet during the afternoon fall.   Step-daughter was concerned about a stroke so she brought her to the emergency department.  She did not have any other neurological deficit.  She did have some shuffling gait.  Upon arrival to the ED, she was hemodynamically stable.  CT brain was done which ruled out any acute stroke.  X-ray right knee showed trace effusion but no fracture.  UA was negative.  Patient was admitted due to ataxia.  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  She has shuffling gait and cognitive disorder but no urinary incontinence.  Since this is chronic, this can be followed up as an outpatient with neurosurgery.  She was seen by PT OT and they recommend SNF.  Case manager working on that.  Insurance authorization in process.  CAD status post stent: No symptoms of ACS.  Continue Toprol-XL and aspirin.  Hyperlipidemia: Continue simvastatin.  Dementia/insomnia: Continue home dose of Risperdal and trazodone.  DVT prophylaxis: Lovenox Code Status: Full code Family Communication:  None present at bedside.  Plan of care discussed with patient in length and he verbalized understanding and agreed with it. Disposition Plan: Discharge to SNF when bed available.  Estimated body mass index is 30.41 kg/m as calculated from the following:   Height as of this  encounter: 5\' 1"  (1.549 m).   Weight as of this encounter: 73 kg.      Nutritional status:               Consultants:   None  Procedures:   None  Antimicrobials:   None   Subjective: Patient seen and examined.  No new complaints.   Objective: Vitals:   03/13/19 2326 03/14/19 0319 03/14/19 0827 03/14/19 1229  BP: 131/85 (!) 142/76 135/73 115/76  Pulse: (!) 110 99 (!) 102 (!) 106  Resp: 17 17 16 16   Temp: 97.9 F (36.6 C) 98.8 F (37.1 C) 98.6 F (37 C) 98.6 F (37 C)  TempSrc: Oral Oral Oral Oral  SpO2: 94% 93% 97% 94%  Weight:      Height:        Intake/Output Summary (Last 24 hours) at 03/14/2019 1328 Last data filed at 03/13/2019 2336 Gross per 24 hour  Intake -  Output 400 ml  Net -400 ml   Filed Weights   03/12/19 0413 03/12/19 2002 03/13/19 0500  Weight: 71 kg 71 kg 73 kg    Examination:  General exam: Appears calm and comfortable  Respiratory system: Clear to auscultation. Respiratory effort normal. Cardiovascular system: S1 & S2 heard, RRR. No JVD, murmurs, rubs, gallops or clicks. No pedal edema. Gastrointestinal system: Abdomen is nondistended, soft and nontender. No organomegaly or masses felt. Normal bowel sounds heard. Central nervous system: Alert and oriented x2. No focal neurological deficits. Extremities: Symmetric 5 x 5  power. Skin: No rashes, lesions or ulcers Psychiatry: Judgement and insight appear poor. Mood & affect appropriate.    Data Reviewed: I have personally reviewed following labs and imaging studies  CBC: Recent Labs  Lab 03/10/19 2046 03/11/19 0613 03/12/19 0809  WBC 9.7 8.1 7.2  NEUTROABS 7.5  --   --   HGB 12.7 11.8* 12.1  HCT 40.6 37.9 37.5  MCV 86.4 86.1 86.2  PLT 296 255 937   Basic Metabolic Panel: Recent Labs  Lab 03/10/19 2046 03/11/19 0613 03/12/19 0809  NA 142 142 143  K 4.3 3.4* 4.3  CL 105 108 109  CO2 25 24 25   GLUCOSE 123* 105* 95  BUN 17 15 10   CREATININE 1.05* 0.87 0.92   CALCIUM 10.1 9.4 9.6   GFR: Estimated Creatinine Clearance: 48.3 mL/min (by C-G formula based on SCr of 0.92 mg/dL). Liver Function Tests: Recent Labs  Lab 03/11/19 0613  AST 23  ALT 20  ALKPHOS 62  BILITOT 0.5  PROT 7.0  ALBUMIN 4.0   No results for input(s): LIPASE, AMYLASE in the last 168 hours. No results for input(s): AMMONIA in the last 168 hours. Coagulation Profile: No results for input(s): INR, PROTIME in the last 168 hours. Cardiac Enzymes: No results for input(s): CKTOTAL, CKMB, CKMBINDEX, TROPONINI in the last 168 hours. BNP (last 3 results) No results for input(s): PROBNP in the last 8760 hours. HbA1C: No results for input(s): HGBA1C in the last 72 hours. CBG: No results for input(s): GLUCAP in the last 168 hours. Lipid Profile: No results for input(s): CHOL, HDL, LDLCALC, TRIG, CHOLHDL, LDLDIRECT in the last 72 hours. Thyroid Function Tests: No results for input(s): TSH, T4TOTAL, FREET4, T3FREE, THYROIDAB in the last 72 hours. Anemia Panel: No results for input(s): VITAMINB12, FOLATE, FERRITIN, TIBC, IRON, RETICCTPCT in the last 72 hours. Sepsis Labs: No results for input(s): PROCALCITON, LATICACIDVEN in the last 168 hours.  Recent Results (from the past 240 hour(s))  SARS CORONAVIRUS 2 (TAT 6-24 HRS) Nasopharyngeal Nasopharyngeal Swab     Status: None   Collection Time: 03/11/19  1:17 AM   Specimen: Nasopharyngeal Swab  Result Value Ref Range Status   SARS Coronavirus 2 NEGATIVE NEGATIVE Final    Comment: (NOTE) SARS-CoV-2 target nucleic acids are NOT DETECTED. The SARS-CoV-2 RNA is generally detectable in upper and lower respiratory specimens during the acute phase of infection. Negative results do not preclude SARS-CoV-2 infection, do not rule out co-infections with other pathogens, and should not be used as the sole basis for treatment or other patient management decisions. Negative results must be combined with clinical observations, patient  history, and epidemiological information. The expected result is Negative. Fact Sheet for Patients: SugarRoll.be Fact Sheet for Healthcare Providers: https://www.woods-mathews.com/ This test is not yet approved or cleared by the [place] FDA and  has been authorized for detection and/or diagnosis of SARS-CoV-2 by FDA under an Emergency Use Authorization (EUA). This EUA will remain  in effect (meaning this test can be used) for the duration of the COVID-19 declaration under Section 56 4(b)(1) of the Act, 21 U.S.C. section 360bbb-3(b)(1), unless the authorization is terminated or revoked sooner. Performed at [facility], [place] [address], [place], [place] [address]       Radiology Studies: No results found.  Scheduled Meds: . aspirin EC  81 mg Oral Daily  . enoxaparin (LOVENOX) injection  40 mg Subcutaneous Q24H  . metoprolol succinate  50 mg Oral Daily  . risperiDONE  1  mg Oral QHS  . simvastatin  20 mg Oral q1800  . sodium chloride flush  3 mL Intravenous Q12H  . traZODone  50 mg Oral QHS   Continuous Infusions: . sodium chloride       LOS: 2 days   Time spent: 29 minutes   Hughie Clossavi Hartford Maulden, MD [facility]  03/14/2019, 1:28 PM   To contact the attending provider between 7A-7P or the covering provider during after hours 7P-7A, please log into the web site [email or web address] and use password [email or web address].

## 2019-03-15 ENCOUNTER — Inpatient Hospital Stay
Admission: RE | Admit: 2019-03-15 | Discharge: 2019-04-04 | Disposition: A | Discharge status: Home / Self Care | Payer: Medicare HMO | Source: Ambulatory Visit | Attending: Internal Medicine | Admitting: Internal Medicine

## 2019-03-15 DIAGNOSIS — R270 Ataxia, unspecified: Secondary | ICD-10-CM | POA: Diagnosis not present

## 2019-03-15 DIAGNOSIS — W19XXXA Unspecified fall, initial encounter: Secondary | ICD-10-CM

## 2019-03-15 DIAGNOSIS — G9389 Other specified disorders of brain: Secondary | ICD-10-CM | POA: Diagnosis not present

## 2019-03-15 DIAGNOSIS — F0150 Vascular dementia without behavioral disturbance: Secondary | ICD-10-CM | POA: Diagnosis not present

## 2019-03-15 DIAGNOSIS — I2510 Atherosclerotic heart disease of native coronary artery without angina pectoris: Secondary | ICD-10-CM | POA: Diagnosis not present

## 2019-03-15 DIAGNOSIS — K219 Gastro-esophageal reflux disease without esophagitis: Secondary | ICD-10-CM | POA: Diagnosis not present

## 2019-03-15 DIAGNOSIS — J3089 Other allergic rhinitis: Secondary | ICD-10-CM | POA: Diagnosis not present

## 2019-03-15 DIAGNOSIS — R41841 Cognitive communication deficit: Secondary | ICD-10-CM | POA: Diagnosis not present

## 2019-03-15 DIAGNOSIS — R262 Difficulty in walking, not elsewhere classified: Secondary | ICD-10-CM | POA: Diagnosis not present

## 2019-03-15 DIAGNOSIS — F0390 Unspecified dementia without behavioral disturbance: Secondary | ICD-10-CM | POA: Diagnosis not present

## 2019-03-15 DIAGNOSIS — G319 Degenerative disease of nervous system, unspecified: Secondary | ICD-10-CM | POA: Diagnosis not present

## 2019-03-15 DIAGNOSIS — G4721 Circadian rhythm sleep disorder, delayed sleep phase type: Secondary | ICD-10-CM | POA: Diagnosis not present

## 2019-03-15 DIAGNOSIS — I10 Essential (primary) hypertension: Secondary | ICD-10-CM | POA: Diagnosis not present

## 2019-03-15 DIAGNOSIS — F339 Major depressive disorder, recurrent, unspecified: Secondary | ICD-10-CM | POA: Diagnosis not present

## 2019-03-15 DIAGNOSIS — R296 Repeated falls: Secondary | ICD-10-CM | POA: Diagnosis not present

## 2019-03-15 DIAGNOSIS — R69 Illness, unspecified: Secondary | ICD-10-CM | POA: Diagnosis not present

## 2019-03-15 DIAGNOSIS — M2550 Pain in unspecified joint: Secondary | ICD-10-CM | POA: Diagnosis not present

## 2019-03-15 DIAGNOSIS — G912 (Idiopathic) normal pressure hydrocephalus: Secondary | ICD-10-CM | POA: Diagnosis not present

## 2019-03-15 DIAGNOSIS — R410 Disorientation, unspecified: Secondary | ICD-10-CM | POA: Diagnosis not present

## 2019-03-15 DIAGNOSIS — M6281 Muscle weakness (generalized): Secondary | ICD-10-CM | POA: Diagnosis not present

## 2019-03-15 DIAGNOSIS — E785 Hyperlipidemia, unspecified: Secondary | ICD-10-CM | POA: Diagnosis not present

## 2019-03-15 DIAGNOSIS — I25118 Atherosclerotic heart disease of native coronary artery with other forms of angina pectoris: Secondary | ICD-10-CM | POA: Diagnosis not present

## 2019-03-15 DIAGNOSIS — F0391 Unspecified dementia with behavioral disturbance: Secondary | ICD-10-CM | POA: Diagnosis not present

## 2019-03-15 DIAGNOSIS — Z7401 Bed confinement status: Secondary | ICD-10-CM | POA: Diagnosis not present

## 2019-03-15 NOTE — Progress Notes (Addendum)
Patient and family report prescription eye glasses missing.  Family reports that patient had eye glasses when transported from home by ambulance.  I personally have not seen any prescription eye glasses and they are not listed in the chart under patient belongings.  Patient transported to [facility] by ambulance and then to [facility] at a later time. Safety Zone reported.

## 2019-03-15 NOTE — Progress Notes (Signed)
Patient's prescription eye glasses have been found.

## 2019-03-15 NOTE — Discharge Summary (Signed)
Physician Discharge Summary  [name]:[id number] DOB: 1940/07/01 DOA: 03/10/2019  PCP: Celene Squibb, MD  Admit date: 03/10/2019 Discharge date: 03/15/2019  Admitted From: Home Disposition: SNF  Recommendations for Outpatient Follow-up:  1. Follow up with PCP in 1-2 weeks 2. Follow-up with neurosurgery in 2 to 4 weeks for further work-up of possible NPH 3. Please obtain BMP/CBC in one week 4. Please follow up on the following pending results:  Home Health: None Equipment/Devices: None  Discharge Condition: Stable CODE STATUS: Full code Diet recommendation: Cardiac  Subjective: Patient seen and examined.  She is alert and oriented x3, better than yesterday.  No complaints.  She is at her baseline with her cognition.  Brief/Interim Summary: MaryStaleyis a77 y.o.female,w hyperlipidemia, cad, gerd, dementia, apparently fell 03/07/2019, and and on 03/11/2019 when she was coming down the hall, walking with the walker and fell down to floor, and fell twice,In am and afternoon. Scraped her right knee on the carpet during the afternoon fall. Step-daughter was concerned about a stroke so she brought her to the emergency department.  She did not have any other neurological deficit.  She did have some shuffling gait.  Upon arrival to the ED, she was hemodynamically stable.  CT brain was done which ruled out any acute stroke.  X-ray right knee showed trace effusion but no fracture.  UA was negative.  Patient was admitted due to ataxia.  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  She was evaluated by PT OT and they recommended SNF which has been arranged for patient and she is hemodynamically stable so she will be discharged.  We recommend that she follow-up with neurosurgery as an outpatient in 2 to 4 weeks for further work-up and evaluation of possible NPH.  Currently she has 2 of the 3 criteria is which are shuffling gait  and cognitive disorder but she does not have any urinary incontinence and her ventriculomegaly is also chronic.  Discharge Diagnoses:  Principal Problem:   Ataxia Active Problems:   Hyperlipemia   Cerebral ventriculomegaly   Fall at home, initial encounter    Discharge Instructions  Discharge Instructions    Discharge patient   Complete by: As directed    Discharge disposition: 03-Skilled [place]   Discharge patient date: 03/15/2019     Allergies as of 03/15/2019   No Known Allergies     Medication List    STOP taking these medications   cephALEXin 500 MG capsule Commonly known as: KEFLEX     TAKE these medications   aspirin EC 81 MG tablet Take 81 mg by mouth daily.   calcium carbonate 500 MG chewable tablet Commonly known as: TUMS - dosed in mg elemental calcium Chew 1 tablet by mouth daily as needed for indigestion.   fluticasone 50 MCG/ACT nasal spray Commonly known as: FLONASE Place 2 sprays into both nostrils daily as needed for allergies or rhinitis.   metoprolol succinate 50 MG 24 hr tablet Commonly known as: TOPROL-XL Take 50 mg by mouth daily.   mirtazapine 30 MG disintegrating tablet Commonly known as: REMERON SOL-TAB Take 1 tablet by mouth daily.   nitroGLYCERIN 0.4 MG SL tablet Commonly known as: NITROSTAT Place 1 tablet (0.4 mg total) under the tongue every 5 (five) minutes as needed. Reported on 10/15/2015   risperiDONE 1 MG tablet Commonly known as: RISPERDAL Take 1 mg by mouth at bedtime.   simvastatin 20 MG tablet Commonly known as:  ZOCOR Take 20 mg by mouth daily.   traZODone 50 MG tablet Commonly known as: DESYREL Take 1 tablet by mouth at bedtime.       Contact information for follow-up providers    Benita Stabile, MD Follow up in 1 week(s).   Specialty: Internal Medicine Contact information: [address] [place] [address] [phone]        [facility] Follow up in  2 week(s).   Specialty: Neurosurgery Contact information: [address] [place] [address] [phone]            Contact information for after-discharge care    Destination    [facility]erred SNF .   Service: Skilled Nursing Contact information: [address] [place] [address] [phone]                 No Known Allergies  Consultations: None   Procedures/Studies: Ct Head Wo Contrast  Result Date: 03/11/2019 CLINICAL DATA:  Fall EXAM: CT HEAD WITHOUT CONTRAST TECHNIQUE: Contiguous axial images were obtained from the base of the skull through the vertex without intravenous contrast. COMPARISON:  01/20/2017 FINDINGS: Brain: There is advanced generalized atrophy. No intracranial hemorrhage or other acute abnormality. There is periventricular hypoattenuation compatible with chronic microvascular disease. Vascular: No abnormal hyperdensity of the major intracranial arteries or dural venous sinuses. No intracranial atherosclerosis. Skull: The visualized skull base, calvarium and extracranial soft tissues are normal. Sinuses/Orbits: No fluid levels or advanced mucosal thickening of the visualized paranasal sinuses. No mastoid or middle ear effusion. The orbits are normal. IMPRESSION: Advanced atrophy and chronic microvascular ischemia without acute intracranial abnormality. Electronically Signed   By: Deatra Robinson M.D.   On: 03/11/2019 00:20   Mr Brain Wo Contrast  Result Date: 03/12/2019 CLINICAL DATA:  Ataxia.  Fall. EXAM: MRI HEAD WITHOUT CONTRAST TECHNIQUE: Multiplanar, multiecho pulse sequences of the brain and surrounding structures were obtained without intravenous contrast. COMPARISON:  Head CT 03/11/2019 FINDINGS: The patient was unable to tolerate the examination which was terminated prior to completion. Axial and coronal diffusion and axial FLAIR sequences were obtained with the latter being moderately to  severely motion degraded. Diffusion imaging is of diagnostic quality, and no acute infarct is identified. There is prominent chronic lateral and third ventriculomegaly with sulcal crowding at the vertex. Patchy to confluent T2 hyperintensities in the cerebral white matter bilaterally are nonspecific but compatible with moderate chronic small vessel ischemic disease, no gross intracranial hemorrhage, intracranial mass effect, or extra-axial fluid collection is identified. IMPRESSION: 1. Motion degraded, incomplete examination. 2. No acute infarct. 3. 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 4. Moderate chronic small vessel ischemic disease. Electronically Signed   By: Sebastian Ache M.D.   On: 03/12/2019 13:55   Dg Knee Complete 4 Views Right  Result Date: 03/10/2019 CLINICAL DATA:  Fall EXAM: RIGHT KNEE - COMPLETE 4+ VIEW COMPARISON:  None. FINDINGS: No acute fracture or traumatic malalignment. Joint spaces are largely maintained on these nonweightbearing images. Trace effusion is present. Small mineralization in the soft tissues of the posteromedial knee may reflect vascular calcification or remote posttraumatic mineralization. Soft tissues are otherwise unremarkable. IMPRESSION: 1. No acute osseous abnormality. Trace knee effusion. 2. Small mineralization in the posteromedial knee may reflect vascular calcification or remote posttraumatic mineralization. Electronically Signed   By: Kreg Shropshire M.D.   On: 03/10/2019 20:44  Discharge Exam: Vitals:   03/14/19 2334 03/15/19 0849  BP: 100/80 128/76  Pulse: 99 (!) 106  Resp: 16 20  Temp: 98 F (36.7 C) 98.4 F (36.9 C)  SpO2: 96% 95%   Vitals:   03/14/19 1950 03/14/19 2334 03/15/19 0300 03/15/19 0849  BP: 123/88 100/80  128/76  Pulse: 93 99  (!) 106  Resp: 17 16  20   Temp: 98.3 F (36.8 C) 98 F (36.7 C)  98.4 F (36.9 C)  TempSrc: Oral Oral  Oral  SpO2: 97% 96%  95%   Weight:   74 kg   Height:        General: Pt is alert, awake, not in acute distress Cardiovascular: RRR, S1/S2 +, no rubs, no gallops Respiratory: CTA bilaterally, no wheezing, no rhonchi Abdominal: Soft, NT, ND, bowel sounds + Extremities: no edema, no cyanosis    The results of significant diagnostics from this hospitalization (including imaging, microbiology, ancillary and laboratory) are listed below for reference.     Microbiology: Recent Results (from the past 240 hour(s))  SARS CORONAVIRUS 2 (TAT 6-24 HRS) Nasopharyngeal Nasopharyngeal Swab     Status: None   Collection Time: 03/11/19  1:17 AM   Specimen: Nasopharyngeal Swab  Result Value Ref Range Status   SARS Coronavirus 2 NEGATIVE NEGATIVE Final    Comment: (NOTE) SARS-CoV-2 target nucleic acids are NOT DETECTED. The SARS-CoV-2 RNA is generally detectable in upper and lower respiratory specimens during the acute phase of infection. Negative results do not preclude SARS-CoV-2 infection, do not rule out co-infections with other pathogens, and should not be used as the sole basis for treatment or other patient management decisions. Negative results must be combined with clinical observations, patient history, and epidemiological information. The expected result is Negative. Fact Sheet for Patients: HairSlick.nohttps://www.fda.gov/media/138098/download Fact Sheet for Healthcare Providers: quierodirigir.comhttps://www.fda.gov/media/138095/download This test is not yet approved or cleared by the [place] FDA and  has been authorized for detection and/or diagnosis of SARS-CoV-2 by FDA under an Emergency Use Authorization (EUA). This EUA will remain  in effect (meaning this test can be used) for the duration of the COVID-19 declaration under Section 56 4(b)(1) of the Act, 21 U.S.C. section 360bbb-3(b)(1), unless the authorization is terminated or revoked sooner. Performed at [facility], [address] [place], [place] [address]       Labs: BNP (last 3 results) No results for input(s): BNP in the last 8760 hours. Basic Metabolic Panel: Recent Labs  Lab 03/10/19 2046 03/11/19 0613 03/12/19 0809  NA 142 142 143  K 4.3 3.4* 4.3  CL 105 108 109  CO2 25 24 25   GLUCOSE 123* 105* 95  BUN 17 15 10   CREATININE 1.05* 0.87 0.92  CALCIUM 10.1 9.4 9.6   Liver Function Tests: Recent Labs  Lab 03/11/19 0613  AST 23  ALT 20  ALKPHOS 62  BILITOT 0.5  PROT 7.0  ALBUMIN 4.0   No results for input(s): LIPASE, AMYLASE in the last 168 hours. No results for input(s): AMMONIA in the last 168 hours. CBC: Recent Labs  Lab 03/10/19 2046 03/11/19 0613 03/12/19 0809  WBC 9.7 8.1 7.2  NEUTROABS 7.5  --   --   HGB 12.7 11.8* 12.1  HCT 40.6 37.9 37.5  MCV 86.4 86.1 86.2  PLT 296 255 239   Cardiac Enzymes: No results for input(s): CKTOTAL, CKMB, CKMBINDEX, TROPONINI in the last 168 hours. BNP: Invalid input(s): POCBNP CBG: No results for input(s): GLUCAP in the last  168 hours. D-Dimer No results for input(s): DDIMER in the last 72 hours. Hgb A1c No results for input(s): HGBA1C in the last 72 hours. Lipid Profile No results for input(s): CHOL, HDL, LDLCALC, TRIG, CHOLHDL, LDLDIRECT in the last 72 hours. Thyroid function studies No results for input(s): TSH, T4TOTAL, T3FREE, THYROIDAB in the last 72 hours.  Invalid input(s): FREET3 Anemia work up No results for input(s): VITAMINB12, FOLATE, FERRITIN, TIBC, IRON, RETICCTPCT in the last 72 hours. Urinalysis    Component Value Date/Time   COLORURINE YELLOW 03/10/2019 2210   APPEARANCEUR CLEAR 03/10/2019 2210   LABSPEC 1.014 03/10/2019 2210   PHURINE 5.0 03/10/2019 2210   GLUCOSEU NEGATIVE 03/10/2019 2210   HGBUR SMALL (A) 03/10/2019 2210   BILIRUBINUR NEGATIVE 03/10/2019 2210   KETONESUR NEGATIVE 03/10/2019 2210   PROTEINUR NEGATIVE 03/10/2019 2210   NITRITE NEGATIVE 03/10/2019 2210   LEUKOCYTESUR NEGATIVE 03/10/2019 2210   Sepsis Labs Invalid  input(s): PROCALCITONIN,  WBC,  LACTICIDVEN Microbiology Recent Results (from the past 240 hour(s))  SARS CORONAVIRUS 2 (TAT 6-24 HRS) Nasopharyngeal Nasopharyngeal Swab     Status: None   Collection Time: 03/11/19  1:17 AM   Specimen: Nasopharyngeal Swab  Result Value Ref Range Status   SARS Coronavirus 2 NEGATIVE NEGATIVE Final    Comment: (NOTE) SARS-CoV-2 target nucleic acids are NOT DETECTED. The SARS-CoV-2 RNA is generally detectable in upper and lower respiratory specimens during the acute phase of infection. Negative results do not preclude SARS-CoV-2 infection, do not rule out co-infections with other pathogens, and should not be used as the sole basis for treatment or other patient management decisions. Negative results must be combined with clinical observations, patient history, and epidemiological information. The expected result is Negative. Fact Sheet for Patients: HairSlick.no Fact Sheet for Healthcare Providers: quierodirigir.com This test is not yet approved or cleared by the [place] FDA and  has been authorized for detection and/or diagnosis of SARS-CoV-2 by FDA under an Emergency Use Authorization (EUA). This EUA will remain  in effect (meaning this test can be used) for the duration of the COVID-19 declaration under Section 56 4(b)(1) of the Act, 21 U.S.C. section 360bbb-3(b)(1), unless the authorization is terminated or revoked sooner. Performed at [facility], [address], [place], [place] [address]      Time coordinating discharge: Over 30 minutes  SIGNED:   Hughie Closs, MD  [facility] 03/15/2019, 10:47 AM  If 7PM-7AM, please contact night-coverage [email or web address] Password [email or web address]

## 2019-03-15 NOTE — TOC Transition Note (Signed)
Transition of Care [facility]) - CM/SW Discharge Note   Patient Details  Name: Katrina Gallegos MRN: [id number] Date of Birth: 1940/06/22  Transition of Care [facility]) CM/SW Contact:  Geralynn Ochs, LCSW Phone Number: 03/15/2019, 1:18 PM   Clinical Narrative:   Nurse to call report to [phone], Room 159.    Final next level of care: Skilled Nursing Facility Barriers to Discharge: Barriers Resolved   Patient Goals and CMS Choice Patient states their goals for this hospitalization and ongoing recovery are:: Pt step-daughter agreeable to rehab CMS Medicare.gov Compare Post Acute Care list provided to:: Patient Represenative (must comment) Choice offered to / list presented to : Adult Children  Discharge Placement              Patient chooses bed at: [facility] Patient to be transferred to facility by: [place] Name of family member notified: [name] Patient and family notified of of transfer: 03/15/19  Discharge Plan and Services In-house Referral: Clinical Social Work Discharge Planning Services: NA Post Acute Care Choice: [place]          DME Arranged: N/A DME Agency: NA       HH Arranged: NA HH Agency: NA        Social Determinants of Health (SDOH) Interventions     Readmission Risk Interventions No flowsheet data found.

## 2019-03-15 NOTE — Progress Notes (Signed)
Patient being discharged to [facility].  Report given.  No IV to remove.  Discharge instructions and prescription information placed in the packet for [facility].  Patient to be transported by [facility].

## 2019-03-15 NOTE — Discharge Instructions (Signed)

## 2019-03-15 NOTE — TOC Progression Note (Signed)
Transition of Care [facility]) - Progression Note    Patient Details  Name: Katrina Gallegos MRN: [id number] Date of Birth: 1940-08-09  Transition of Care [facility]) CM/SW [place], [place] Phone Number: 03/15/2019, 10:43 AM  Clinical Narrative:  CSW received call from [facility] that authorization has been received on patient. CSW verified with [facility] that they do not need an updated COVID test prior to admit, they can take patient today. CSW alerted MD, awaiting orders if patient is stable for transfer. CSW to follow.    Expected Discharge Plan: Skilled Nursing Facility Barriers to Discharge: SNF Pending discharge orders, SNF Pending discharge summary  Expected Discharge Plan and Services Expected Discharge Plan: [place] In-house Referral: Clinical Social Work Discharge Planning Services: NA Post Acute Care Choice: [place] Living arrangements for the past 2 months: Single Family Home                 DME Arranged: N/A DME Agency: NA       HH Arranged: NA HH Agency: NA         Social Determinants of Health (SDOH) Interventions    Readmission Risk Interventions No flowsheet data found.

## 2019-03-15 NOTE — Plan of Care (Signed)
Progressing towards goals

## 2019-03-15 NOTE — Care Management Important Message (Signed)
Important Message  Patient Details  Name: Katrina Gallegos MRN: [id number] Date of Birth: [birth year]   Medicare Important Message Given:  Yes     Orbie Pyo 03/15/2019, 9:19 AM

## 2019-03-16 ENCOUNTER — Non-Acute Institutional Stay (SKILLED_NURSING_FACILITY): Payer: Medicare HMO | Admitting: Adult Health

## 2019-03-16 ENCOUNTER — Encounter: Payer: Self-pay | Admitting: Adult Health

## 2019-03-16 DIAGNOSIS — I25118 Atherosclerotic heart disease of native coronary artery with other forms of angina pectoris: Secondary | ICD-10-CM | POA: Diagnosis not present

## 2019-03-16 DIAGNOSIS — F0390 Unspecified dementia without behavioral disturbance: Secondary | ICD-10-CM

## 2019-03-16 DIAGNOSIS — R270 Ataxia, unspecified: Secondary | ICD-10-CM

## 2019-03-16 DIAGNOSIS — E785 Hyperlipidemia, unspecified: Secondary | ICD-10-CM

## 2019-03-16 DIAGNOSIS — R69 Illness, unspecified: Secondary | ICD-10-CM | POA: Diagnosis not present

## 2019-03-16 DIAGNOSIS — I10 Essential (primary) hypertension: Secondary | ICD-10-CM

## 2019-03-16 DIAGNOSIS — F339 Major depressive disorder, recurrent, unspecified: Secondary | ICD-10-CM

## 2019-03-16 DIAGNOSIS — G9389 Other specified disorders of brain: Secondary | ICD-10-CM | POA: Diagnosis not present

## 2019-03-16 DIAGNOSIS — J3089 Other allergic rhinitis: Secondary | ICD-10-CM

## 2019-03-16 DIAGNOSIS — F0150 Vascular dementia without behavioral disturbance: Secondary | ICD-10-CM

## 2019-03-16 NOTE — Progress Notes (Signed)
Location:    [place] Room Number: 159/P Place of Service:  SNF (31)   CODE STATUS: DNR  No Known Allergies  Chief Complaint  Patient presents with  . Hospitalization Follow-up    Hospitalization Follow Up    HPI:  She is a 78 year old woman who has been hospitalized from 03-10-19 through 03-15-19. She had had 2 falls prior to her hospitalization. She had been experiencing some ataxia. Her family was concerned about a cva and she was brought to the ED for further evaluation. She had an MRI which demonstrated chronic ventriculomegaly which could be NPH. She does have a shuffling gait cognition impairment; but does not have urinary incontinence. She will need a neurological follow up. She is here for short term rehab with her goal to return back home. There are no reports of uncontrolled pain; no reports of agitation or anxiety. No reports of insomnia or changes in appetite: she will continue to be followed for her chronic illnesses including: dementia; hypertension; dyslipidemia.   Past Medical History:  Diagnosis Date  . Chest pain   . Dementia ([place])   . Dyslipidemia   . GERD (gastroesophageal reflux disease)   . Sinus tachycardia     Past Surgical History:  Procedure Laterality Date  . CORONARY ANGIOPLASTY WITH STENT PLACEMENT      Social History   Socioeconomic History  . Marital status: Married    Spouse name: Not on file  . Number of children: Not on file  . Years of education: Not on file  . Highest education level: Not on file  Occupational History  . Not on file  Social Needs  . Financial resource strain: Not on file  . Food insecurity    Worry: Not on file    Inability: Not on file  . Transportation needs    Medical: Not on file    Non-medical: Not on file  Tobacco Use  . Smoking status: Never Smoker  . Smokeless tobacco: Never Used  Substance and Sexual Activity  . Alcohol use: No    Alcohol/week: 0.0 standard drinks  . Drug  use: No  . Sexual activity: Not on file  Lifestyle  . Physical activity    Days per week: Not on file    Minutes per session: Not on file  . Stress: Not on file  Relationships  . Social Herbalist on phone: Not on file    Gets together: Not on file    Attends religious service: Not on file    Active member of club or organization: Not on file    Attends meetings of clubs or organizations: Not on file    Relationship status: Not on file  . Intimate partner violence    Fear of current or ex partner: Not on file    Emotionally abused: Not on file    Physically abused: Not on file    Forced sexual activity: Not on file  Other Topics Concern  . Not on file  Social History Narrative  . Not on file   Family History  Problem Relation Age of Onset  . Stroke Father   . Stroke Mother   . Diabetes Mother   . CAD Sister   . Diabetes Sister   . CAD Sister   . Diabetes Sister       VITAL SIGNS BP (!) 172/96   Pulse 79   Temp (!) 97.3 F (36.3 C) (Oral)  Resp 20   Ht 5\' 1"  (1.549 m)   Wt 154 lb 3.2 oz (69.9 kg)   BMI 29.14 kg/m   Outpatient Encounter Medications as of 03/16/2019  Medication Sig  . aspirin EC 81 MG tablet Take 81 mg by mouth daily.  . calcium carbonate (TUMS - DOSED IN MG ELEMENTAL CALCIUM) 500 MG chewable tablet Chew 1 tablet by mouth daily as needed for indigestion.   . fluticasone (FLONASE) 50 MCG/ACT nasal spray Place 2 sprays into both nostrils daily as needed for allergies or rhinitis.   . metoprolol (TOPROL-XL) 50 MG 24 hr tablet Take 50 mg by mouth daily.    . mirtazapine (REMERON SOL-TAB) 30 MG disintegrating tablet Take 1 tablet by mouth daily.  . nitroGLYCERIN (NITROSTAT) 0.4 MG SL tablet Place 1 tablet (0.4 mg total) under the tongue every 5 (five) minutes as needed. Reported on 10/15/2015  . NON FORMULARY Diet: Regular, NAS, Consistent Carbohydrate  . risperiDONE (RISPERDAL) 1 MG tablet Take 1 mg by mouth at bedtime.  . simvastatin  (ZOCOR) 20 MG tablet Take 20 mg by mouth daily.  . traZODone (DESYREL) 50 MG tablet Take 1 tablet by mouth at bedtime.   No facility-administered encounter medications on file as of 03/16/2019.      SIGNIFICANT DIAGNOSTIC EXAMS  TODAY;   03-10-19: right knee x-ray:  1. No acute osseous abnormality. Trace knee effusion. 2. Small mineralization in the posteromedial knee may reflect vascular calcification or remote posttraumatic mineralization.  03-10-19: ct of head: Advanced atrophy and chronic microvascular ischemia without acute intracranial abnormality  03-12-19: MRI of brain:  1. Motion degraded, incomplete examination. 2. No acute infarct. 3. 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 4. Moderate chronic small vessel ischemic disease.  LABS REVIEWED TODAY;   03-10-19: wbc 9.7; hgb 12.7; hct 40.6; mcv 86.4 plt 296; glucose 123; bun 17; creat 1.05; k+ 4.3;na++ 142; ca 10.1; 03-11-19: wbc 81; hgb 11.8; hct 37.9; mcv 86.1; plt 255; glucose 105; bun 15; creat 0.87; k+ 3.4; an++ 142; ca 9.4; liver normal albumin 4.0 03-12-19: wbc 7.2; hgb 12.1; hct 37.5; mcv 86.2; plt 239; glucose 95; bun 10; creat 0.92; k+ 4.3; na++ 143; ca 9.6     Review of Systems  Reason unable to perform ROS: poor historian.    Physical Exam Constitutional:      General: She is not in acute distress.    Appearance: She is well-developed. She is not diaphoretic.  Neck:     Musculoskeletal: Neck supple.     Thyroid: No thyromegaly.  Cardiovascular:     Rate and Rhythm: Normal rate and regular rhythm.     Pulses: Normal pulses.     Heart sounds: Normal heart sounds.     Comments: History of cardiac stent  Pulmonary:     Effort: Pulmonary effort is normal. No respiratory distress.     Breath sounds: Normal breath sounds.  Abdominal:     General: Bowel sounds are normal. There is no distension.     Palpations: Abdomen is soft.      Tenderness: There is no abdominal tenderness.  Musculoskeletal:     Right lower leg: No edema.     Left lower leg: No edema.     Comments: Is able to move all extremities   Lymphadenopathy:     Cervical: No cervical adenopathy.  Skin:    General: Skin is warm and dry.  Neurological:     Mental  Status: She is alert. Mental status is at baseline.  Psychiatric:        Mood and Affect: Mood normal.      ASSESSMENT/ PLAN:  TODAY  1. Essential hypertension: is stable b/p  172/96 will continue toprol xl 50 mg daily asa 81 mg daily   2. 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  Chronic non-seasonal allergic rhinitis: is stable will continue flonase daily as needed  4. Hyperlipidemia unspecified hyperlipidemia type: is stable will continue zocor 20 mg daily  5. 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 Vascular dementia without behavioral disturbance: is without change: weight is 155 pounds will monitor   7. Major depression recurrent chronic: is stable will continue trazodone 50 mg nightly will monitor   8. 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 Cerebral ventriculomegaly: is stable is awaiting neurological consult.   MD is aware of resident's narcotic use and is in agreement with current plan of care. We will attempt to wean resident as appropriate.  Synthia Innocenteborah Johnluke Haugen NP [facility]  Contact [phone] Monday through Friday 8am- 5pm  After hours call [phone]

## 2019-03-20 ENCOUNTER — Encounter (HOSPITAL_COMMUNITY)
Admission: RE | Admit: 2019-03-20 | Discharge: 2019-03-20 | Disposition: A | Discharge status: Home / Self Care | Payer: Medicare HMO | Source: Skilled Nursing Facility | Attending: Internal Medicine | Admitting: Internal Medicine

## 2019-03-20 DIAGNOSIS — E785 Hyperlipidemia, unspecified: Secondary | ICD-10-CM | POA: Insufficient documentation

## 2019-03-20 LAB — CBC
HCT: 42.1 % (ref 36.0–46.0)
Hemoglobin: 13 g/dL (ref 12.0–15.0)
MCH: 26.9 pg (ref 26.0–34.0)
MCHC: 30.9 g/dL (ref 30.0–36.0)
MCV: 87.2 fL (ref 80.0–100.0)
Platelets: 335 10*3/uL (ref 150–400)
RBC: 4.83 MIL/uL (ref 3.87–5.11)
RDW: 14.2 % (ref 11.5–15.5)
WBC: 8.6 10*3/uL (ref 4.0–10.5)
nRBC: 0 % (ref 0.0–0.2)

## 2019-03-20 LAB — BASIC METABOLIC PANEL
Anion gap: 13 (ref 5–15)
BUN: 19 mg/dL (ref 8–23)
CO2: 24 mmol/L (ref 22–32)
Calcium: 9.8 mg/dL (ref 8.9–10.3)
Chloride: 103 mmol/L (ref 98–111)
Creatinine, Ser: 1 mg/dL (ref 0.44–1.00)
GFR calc Af Amer: >60 mL/min (ref >60)
GFR calc non Af Amer: 55 mL/min — ABNORMAL LOW (ref >60)
Glucose, Bld: 128 mg/dL — ABNORMAL HIGH (ref 70–99)
Potassium: 4 mmol/L (ref 3.5–5.1)
Sodium: 140 mmol/L (ref 135–145)

## 2019-03-20 LAB — MAGNESIUM: Magnesium: 2.3 mg/dL (ref 1.7–2.4)

## 2019-03-22 ENCOUNTER — Encounter: Payer: Self-pay | Admitting: Internal Medicine

## 2019-03-22 ENCOUNTER — Other Ambulatory Visit: Payer: Self-pay | Admitting: Adult Health

## 2019-03-22 ENCOUNTER — Non-Acute Institutional Stay (SKILLED_NURSING_FACILITY): Payer: Medicare HMO | Admitting: Internal Medicine

## 2019-03-22 DIAGNOSIS — E785 Hyperlipidemia, unspecified: Secondary | ICD-10-CM

## 2019-03-22 DIAGNOSIS — F0390 Unspecified dementia without behavioral disturbance: Secondary | ICD-10-CM

## 2019-03-22 DIAGNOSIS — R69 Illness, unspecified: Secondary | ICD-10-CM | POA: Diagnosis not present

## 2019-03-22 DIAGNOSIS — I25118 Atherosclerotic heart disease of native coronary artery with other forms of angina pectoris: Secondary | ICD-10-CM

## 2019-03-22 DIAGNOSIS — F0150 Vascular dementia without behavioral disturbance: Secondary | ICD-10-CM

## 2019-03-22 DIAGNOSIS — R270 Ataxia, unspecified: Secondary | ICD-10-CM | POA: Diagnosis not present

## 2019-03-22 NOTE — Progress Notes (Signed)
Location:  [facility] Room Number: 159-P Place of Service:  SNF [phone])  Katrina Aye Kathleene Hazel, MD  Patient Care Team: Benita Stabile, MD as PCP - General (Internal Medicine) Irene Limbo., MD (Ophthalmology) Wyline Mood Dorothe Pea, MD as Consulting Physician (Cardiology) Roe Rutherford, NP as Nurse Practitioner (Adult Health Nurse Practitioner)  Extended Emergency Contact Information Primary Emergency Contact: Sprinkle,[name] Home Phone: [phone] Mobile Phone: [phone] Relation: Other Preferred language: English Interpreter needed? No    Allergies: Patient has no known allergies.  Chief Complaint  Patient presents with  . New Admit To SNF    New admission to [facility]    HPI: Patient is a 78 y.o. female with hyperlipidemia, CAD, GERD, dementia, who was walking with a walker and fell down to the floor twice on the day of arrival to the ED anything ED.  Patient stepdaughter was concerned about a stroke so she brought her to the emergency department.  She did not have any neurological deficit but she did have a shuffling gait.  On arrival to the ED she was hemodynamically stable.  CT of the brain was done to rule out any acute stroke.  X-ray of the knee showed right trace effusion but no fracture.  UA was negative.  Patient was admitted to [facility] from 10/9-14 for ataxia.  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  She was evaluated by PT and they recommended skilled nursing facility.  Patient is admitted to skilled nursing facility for OT/PT.  While at skilled nursing facility patient will be followed for CAD treated with metoprolol, ASA and statin, hyperlipidemia treated with Zocor+ and dementia treated with Risperdal.  Past Medical History:  Diagnosis Date  . Chest pain   . Dementia (HCC)   . Dyslipidemia   . GERD (gastroesophageal reflux disease)   . Sinus tachycardia     Past  Surgical History:  Procedure Laterality Date  . CORONARY ANGIOPLASTY WITH STENT PLACEMENT      Allergies as of 03/22/2019   No Known Allergies     Medication List    Notice   This visit is during an admission. Changes to the med list made in this visit will be reflected in the After Visit Summary of the admission.    Current Outpatient Medications on File Prior to Visit  Medication Sig Dispense Refill  . aspirin EC 81 MG tablet Take 81 mg by mouth daily.    . calcium carbonate (TUMS - DOSED IN MG ELEMENTAL CALCIUM) 500 MG chewable tablet Chew 1 tablet by mouth daily as needed for indigestion.     . fluticasone (FLONASE) 50 MCG/ACT nasal spray Place 2 sprays into both nostrils daily as needed for allergies or rhinitis.     . metoprolol (TOPROL-XL) 50 MG 24 hr tablet Take 50 mg by mouth daily.      . mirtazapine (REMERON SOL-TAB) 30 MG disintegrating tablet Take 1 tablet by mouth daily.    . nitroGLYCERIN (NITROSTAT) 0.4 MG SL tablet Place 1 tablet (0.4 mg total) under the tongue every 5 (five) minutes as needed. Reported on 10/15/2015 25 tablet 3  . NON FORMULARY Diet: Regular, NAS, Consistent Carbohydrate    . risperiDONE (RISPERDAL) 1 MG tablet Take 1 mg by mouth at bedtime.    . simvastatin (ZOCOR) 20 MG tablet Take 20 mg by mouth daily.    . traZODone (DESYREL) 50 MG tablet Take 1 tablet by mouth  at bedtime.     No current facility-administered medications on file prior to visit.      No orders of the defined types were placed in this encounter.    There is no immunization history on file for this patient.  Social History   Tobacco Use  . Smoking status: Never Smoker  . Smokeless tobacco: Never Used  Substance Use Topics  . Alcohol use: No    Alcohol/week: 0.0 standard drinks    Review of Systems  DATA OBTAINED: from patient-limited; nurse-no acute concerns GENERAL:  no fevers, fatigue, appetite changes SKIN: No itching, rash HEENT: No complaint RESPIRATORY: No  cough, wheezing, SOB CARDIAC: No chest pain, palpitations, lower extremity edema  GI: No abdominal pain, No N/V/D or constipation, No heartburn or reflux  GU: No dysuria, frequency or urgency, or incontinence  MUSCULOSKELETAL: No unrelieved bone/joint pain NEUROLOGIC: No headache, dizziness  PSYCHIATRIC: No overt anxiety or sadness  Vitals:   03/22/19 1613  BP: 135/62  Pulse: 78  Resp: 20  Temp: 98.2 F (36.8 C)  SpO2: 98%   Body mass index is 29.4 kg/m. Physical Exam  GENERAL APPEARANCE: Alert, conversant, No acute distress  SKIN: No diaphoresis rash HEENT: Unremarkable RESPIRATORY: Breathing is even, unlabored. Lung sounds are clear   CARDIOVASCULAR: Heart RRR no murmurs, rubs or gallops. No peripheral edema  GASTROINTESTINAL: Abdomen is soft, non-tender, not distended w/ normal bowel sounds.  GENITOURINARY: Bladder non tender, not distended  MUSCULOSKELETAL: No abnormal joints or musculature NEUROLOGIC: Cranial nerves 2-12 grossly intact. Moves all extremities PSYCHIATRIC: Mood and affect with dementia, no behavioral issues  Patient Active Problem List   Diagnosis Date Noted  . Cerebral ventriculomegaly 03/15/2019  . Fall at home, initial encounter 03/15/2019  . 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 CAD 01/07/2009  . Hyperlipemia 12/31/2008  . GERD 12/31/2008    CMP     Component Value Date/Time   NA 140 03/20/2019 1014   K 4.0 03/20/2019 1014   CL 103 03/20/2019 1014   CO2 24 03/20/2019 1014   GLUCOSE 128 (H) 03/20/2019 1014   BUN 19 03/20/2019 1014   CREATININE 1.00 03/20/2019 1014   CREATININE 1.01 09/24/2014 1013   CALCIUM 9.8 03/20/2019 1014   PROT 7.0 03/11/2019 0613   ALBUMIN 4.0 03/11/2019 0613   AST 23 03/11/2019 0613   ALT 20 03/11/2019 0613   ALKPHOS 62 03/11/2019 0613   BILITOT 0.5 03/11/2019 0613   GFRNONAA 55 (L) 03/20/2019 1014   GFRAA >60 03/20/2019 1014   Recent Labs    03/11/19 0613 03/12/19 0809 03/20/19 1014  NA 142 143 140  K 3.4* 4.3 4.0   CL 108 109 103  CO2 24 25 24   GLUCOSE 105* 95 128*  BUN 15 10 19   CREATININE 0.87 0.92 1.00  CALCIUM 9.4 9.6 9.8  MG  --   --  2.3   Recent Labs    03/11/19 0613  AST 23  ALT 20  ALKPHOS 62  BILITOT 0.5  PROT 7.0  ALBUMIN 4.0   Recent Labs    03/10/19 2046 03/11/19 0613 03/12/19 0809 03/20/19 1014  WBC 9.7 8.1 7.2 8.6  NEUTROABS 7.5  --   --   --   HGB 12.7 11.8* 12.1 13.0  HCT 40.6 37.9 37.5 42.1  MCV 86.4 86.1 86.2 87.2  PLT 296 255 239 335   No results for input(s): CHOL, LDLCALC, TRIG in the last 8760 hours.  Invalid input(s): HCL No results found for: MICROALBUR No  results found for: TSH No results found for: HGBA1C Lab Results  Component Value Date   CHOL 235 04/02/2009   HDL 4.4 04/02/2009   LDLCALC 139 04/02/2009   TRIG 212 04/02/2009    Significant Diagnostic Results in last 30 days:  Ct Head Wo Contrast  Result Date: 03/11/2019 CLINICAL DATA:  Fall EXAM: CT HEAD WITHOUT CONTRAST TECHNIQUE: Contiguous axial images were obtained from the base of the skull through the vertex without intravenous contrast. COMPARISON:  01/20/2017 FINDINGS: Brain: There is advanced generalized atrophy. No intracranial hemorrhage or other acute abnormality. There is periventricular hypoattenuation compatible with chronic microvascular disease. Vascular: No abnormal hyperdensity of the major intracranial arteries or dural venous sinuses. No intracranial atherosclerosis. Skull: The visualized skull base, calvarium and extracranial soft tissues are normal. Sinuses/Orbits: No fluid levels or advanced mucosal thickening of the visualized paranasal sinuses. No mastoid or middle ear effusion. The orbits are normal. IMPRESSION: Advanced atrophy and chronic microvascular ischemia without acute intracranial abnormality. Electronically Signed   By: Deatra RobinsonKevin  Herman M.D.   On: 03/11/2019 00:20   Mr Brain Wo Contrast  Result Date: 03/12/2019 CLINICAL DATA:  Ataxia.  Fall. EXAM: MRI HEAD  WITHOUT CONTRAST TECHNIQUE: Multiplanar, multiecho pulse sequences of the brain and surrounding structures were obtained without intravenous contrast. COMPARISON:  Head CT 03/11/2019 FINDINGS: The patient was unable to tolerate the examination which was terminated prior to completion. Axial and coronal diffusion and axial FLAIR sequences were obtained with the latter being moderately to severely motion degraded. Diffusion imaging is of diagnostic quality, and no acute infarct is identified. There is prominent chronic lateral and third ventriculomegaly with sulcal crowding at the vertex. Patchy to confluent T2 hyperintensities in the cerebral white matter bilaterally are nonspecific but compatible with moderate chronic small vessel ischemic disease, no gross intracranial hemorrhage, intracranial mass effect, or extra-axial fluid collection is identified. IMPRESSION: 1. Motion degraded, incomplete examination. 2. No acute infarct. 3. 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 4. Moderate chronic small vessel ischemic disease. Electronically Signed   By: Sebastian AcheAllen  Grady M.D.   On: 03/12/2019 13:55   Dg Knee Complete 4 Views Right  Result Date: 03/10/2019 CLINICAL DATA:  Fall EXAM: RIGHT KNEE - COMPLETE 4+ VIEW COMPARISON:  None. FINDINGS: No acute fracture or traumatic malalignment. Joint spaces are largely maintained on these nonweightbearing images. Trace effusion is present. Small mineralization in the soft tissues of the posteromedial knee may reflect vascular calcification or remote posttraumatic mineralization. Soft tissues are otherwise unremarkable. IMPRESSION: 1. No acute osseous abnormality. Trace knee effusion. 2. Small mineralization in the posteromedial knee may reflect vascular calcification or remote posttraumatic mineralization. Electronically Signed   By: Kreg ShropshirePrice  DeHay M.D.   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

## 2019-03-23 ENCOUNTER — Non-Acute Institutional Stay (SKILLED_NURSING_FACILITY): Payer: Medicare HMO | Admitting: Adult Health

## 2019-03-23 ENCOUNTER — Encounter: Payer: Self-pay | Admitting: Adult Health

## 2019-03-23 DIAGNOSIS — F339 Major depressive disorder, recurrent, unspecified: Secondary | ICD-10-CM | POA: Insufficient documentation

## 2019-03-23 DIAGNOSIS — J3089 Other allergic rhinitis: Secondary | ICD-10-CM | POA: Insufficient documentation

## 2019-03-23 DIAGNOSIS — I10 Essential (primary) hypertension: Secondary | ICD-10-CM | POA: Diagnosis not present

## 2019-03-23 DIAGNOSIS — F0150 Vascular dementia without behavioral disturbance: Secondary | ICD-10-CM | POA: Insufficient documentation

## 2019-03-23 DIAGNOSIS — I25118 Atherosclerotic heart disease of native coronary artery with other forms of angina pectoris: Secondary | ICD-10-CM

## 2019-03-23 DIAGNOSIS — F323 Major depressive disorder, single episode, severe with psychotic features: Secondary | ICD-10-CM | POA: Insufficient documentation

## 2019-03-23 DIAGNOSIS — F0390 Unspecified dementia without behavioral disturbance: Secondary | ICD-10-CM | POA: Insufficient documentation

## 2019-03-23 NOTE — Progress Notes (Signed)
Location:  [facility] Room Number: 159-P Place of Service:  SNF (31)   CODE STATUS: DNR  No Known Allergies  Chief Complaint  Patient presents with  . 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      HPI:  She is a 78 year old short term rehab patient being seen for the management of her chronic illnesses: cad; hypertension; allergies. There are no reports of uncontrolled; no changes in her appetite; no agitation or anxiety.   Past Medical History:  Diagnosis Date  . Chest pain   . Dementia ([place])   . Dyslipidemia   . GERD (gastroesophageal reflux disease)   . Sinus tachycardia     Past Surgical History:  Procedure Laterality Date  . CORONARY ANGIOPLASTY WITH STENT PLACEMENT      Social History   Socioeconomic History  . Marital status: Married    Spouse name: Not on file  . Number of children: Not on file  . Years of education: Not on file  . Highest education level: Not on file  Occupational History  . Not on file  Social Needs  . Financial resource strain: Not on file  . Food insecurity    Worry: Not on file    Inability: Not on file  . Transportation needs    Medical: Not on file    Non-medical: Not on file  Tobacco Use  . Smoking status: Never Smoker  . Smokeless tobacco: Never Used  Substance and Sexual Activity  . Alcohol use: No    Alcohol/week: 0.0 standard drinks  . Drug use: No  . Sexual activity: Not on file  Lifestyle  . Physical activity    Days per week: Not on file    Minutes per session: Not on file  . Stress: Not on file  Relationships  . Social Herbalist on phone: Not on file    Gets together: Not on file    Attends religious service: Not on file    Active member of club or organization: Not on file    Attends meetings of clubs or organizations: Not on file    Relationship status: Not on file  . Intimate partner violence    Fear of current or ex partner: Not on file    Emotionally abused: Not on file    Physically abused: Not on file    Forced sexual activity: Not on file  Other Topics Concern  . Not on file  Social History Narrative  . Not on file   Family History  Problem Relation Age of Onset  . Stroke Father   . Stroke Mother   . Diabetes Mother   . CAD Sister   . Diabetes Sister   . CAD Sister   . Diabetes Sister       VITAL SIGNS BP (!) 143/89   Pulse 95   Temp 98.1 F (36.7 C) (Oral)   Resp 17   Ht 5\' 1"  (1.549 m)   Wt 155 lb 9.6 oz (70.6 kg)   SpO2 94%   BMI 29.40 kg/m   Outpatient Encounter Medications as of 03/23/2019  Medication Sig  . aspirin EC 81 MG tablet Take 81 mg by mouth daily.  . calcium carbonate (TUMS - DOSED IN  MG ELEMENTAL CALCIUM) 500 MG chewable tablet Chew 1 tablet by mouth daily as needed for indigestion.   . fluticasone (FLONASE) 50 MCG/ACT nasal spray Place 2 sprays into both nostrils daily as needed for allergies or rhinitis.   . Glucerna (GLUCERNA) LIQD Take 237 mLs by mouth 2 (two) times daily between meals.  . metoprolol (TOPROL-XL) 50 MG 24 hr tablet Take 50 mg by mouth daily.    . mirtazapine (REMERON SOL-TAB) 30 MG disintegrating tablet Take 1 tablet by mouth daily.  . nitroGLYCERIN (NITROSTAT) 0.4 MG SL tablet Place 1 tablet (0.4 mg total) under the tongue every 5 (five) minutes as needed. Reported on 10/15/2015  . NON FORMULARY Diet: Regular, NAS, Consistent Carbohydrate  . risperiDONE (RISPERDAL) 1 MG tablet Take 1 mg by mouth at bedtime.  . simvastatin (ZOCOR) 20 MG tablet Take 20 mg by mouth daily.  . traZODone (DESYREL) 50 MG tablet Take 1 tablet by mouth at bedtime.   No facility-administered encounter medications on file as of 03/23/2019.      SIGNIFICANT DIAGNOSTIC EXAMS  PREVIOUS;   03-10-19: right knee x-ray:   1. No acute osseous abnormality. Trace knee effusion. 2. Small mineralization in the posteromedial knee may reflect vascular calcification or remote posttraumatic mineralization.  03-10-19: ct of head: Advanced atrophy and chronic microvascular ischemia without acute intracranial abnormality  03-12-19: MRI of brain:  1. Motion degraded, incomplete examination. 2. No acute infarct. 3. 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 4. Moderate chronic small vessel ischemic disease.  NO NEW EXAMS.   LABS REVIEWED PREVIOUS;   03-10-19: wbc 9.7; hgb 12.7; hct 40.6; mcv 86.4 plt 296; glucose 123; bun 17; creat 1.05; k+ 4.3;na++ 142; ca 10.1; 03-11-19: wbc 81; hgb 11.8; hct 37.9; mcv 86.1; plt 255; glucose 105; bun 15; creat 0.87; k+ 3.4; an++ 142; ca 9.4; liver normal albumin 4.0 03-12-19: wbc 7.2; hgb 12.1; hct 37.5; mcv 86.2; plt 239; glucose 95; bun 10; creat 0.92; k+ 4.3; na++ 143; ca 9.6    TODAY;   03-20-19: wbc 8.6; hgb 13.0; hct 42.1; mcv 87.2 plt 335; glucose 128; bun 19; creat 1.00; k+ 4.0; na++ 140; ca 9.8 mag 2.3    Review of Systems  Reason unable to perform ROS: poor historian      Physical Exam Constitutional:      General: She is not in acute distress.    Appearance: She is well-developed. She is not diaphoretic.  Neck:     Musculoskeletal: Neck supple.     Thyroid: No thyromegaly.  Cardiovascular:     Rate and Rhythm: Normal rate and regular rhythm.     Pulses: Normal pulses.     Heart sounds: Normal heart sounds.     Comments:  History of cardiac stent  Pulmonary:     Effort: Pulmonary effort is normal. No respiratory distress.     Breath sounds: Normal breath sounds.  Abdominal:     General: Bowel sounds are normal. There is no distension.     Palpations: Abdomen is soft.     Tenderness: There is no abdominal tenderness.  Musculoskeletal:     Right lower leg: No edema.     Left lower leg: No  edema.     Comments: Is able to move all extremities   Lymphadenopathy:     Cervical: No cervical adenopathy.  Skin:    General: Skin is warm and dry.  Neurological:     Mental Status:  She is alert. Mental status is at baseline.  Psychiatric:        Mood and Affect: Mood normal.     ASSESSMENT/ PLAN:  TODAY  1. 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 Essential hypertension: is stable b/p 143/89 will continue toprol xl 50 mg daily asa 81 mg daily  3 chronic non-seasonal allergic rhinitis: is stable will continue flonase daily as needed     PREVIOUS   4. Hyperlipidemia unspecified hyperlipidemia type: is stable will continue zocor 20 mg daily  5. 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 Vascular dementia without behavioral disturbance: is without change: weight is 155 pounds will monitor   7. Major depression recurrent chronic: is stable will continue trazodone 50 mg nightly will monitor   8. 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 Cerebral ventriculomegaly: is stable is awaiting neurological consult.      MD is aware of resident's narcotic use and is in agreement with current plan of care. We will attempt to wean resident as appropriate.  Synthia Innocenteborah Lawrance Wiedemann NP [facility]  Contact [phone] Monday through Friday 8am- 5pm  After hours call [phone]

## 2019-03-25 ENCOUNTER — Encounter: Payer: Self-pay | Admitting: Internal Medicine

## 2019-03-28 DIAGNOSIS — G319 Degenerative disease of nervous system, unspecified: Secondary | ICD-10-CM | POA: Diagnosis not present

## 2019-03-29 ENCOUNTER — Non-Acute Institutional Stay (SKILLED_NURSING_FACILITY): Payer: Medicare HMO | Admitting: Adult Health

## 2019-03-29 ENCOUNTER — Encounter: Payer: Self-pay | Admitting: Adult Health

## 2019-03-29 DIAGNOSIS — F0150 Vascular dementia without behavioral disturbance: Secondary | ICD-10-CM

## 2019-03-29 DIAGNOSIS — R69 Illness, unspecified: Secondary | ICD-10-CM | POA: Diagnosis not present

## 2019-03-29 DIAGNOSIS — R270 Ataxia, unspecified: Secondary | ICD-10-CM | POA: Diagnosis not present

## 2019-03-29 DIAGNOSIS — F339 Major depressive disorder, recurrent, unspecified: Secondary | ICD-10-CM

## 2019-03-29 NOTE — Progress Notes (Signed)
Location:    [facility] Room Number: 159/P Place of Service:  SNF (31)   CODE STATUS: DNR  No Known Allergies  Chief Complaint  Patient presents with  . Acute Visit    Care Plan Meeting    HPI:  We have come together for her care plan meeting. BIMS 10/15 mood 4/30. Her weight is stable she has a good appetite. She will need 24 hour care at home or will need assisted living. Physically she is doing well; however; her cognition is poor. She has had 3 falls; one with a skin tear. Her goal remains to return back home. There are no reports of uncontrolled pain. She is restless at times. She continues to be followed for her chronic illnesses including: ataxia; dementia depression.   Past Medical History:  Diagnosis Date  . Chest pain   . Dementia ([place])   . Dyslipidemia   . GERD (gastroesophageal reflux disease)   . Sinus tachycardia     Past Surgical History:  Procedure Laterality Date  . CORONARY ANGIOPLASTY WITH STENT PLACEMENT      Social History   Socioeconomic History  . Marital status: Married    Spouse name: Not on file  . Number of children: Not on file  . Years of education: Not on file  . Highest education level: Not on file  Occupational History  . Not on file  Social Needs  . Financial resource strain: Not on file  . Food insecurity    Worry: Not on file    Inability: Not on file  . Transportation needs    Medical: Not on file    Non-medical: Not on file  Tobacco Use  . Smoking status: Never Smoker  . Smokeless tobacco: Never Used  Substance and Sexual Activity  . Alcohol use: No    Alcohol/week: 0.0 standard drinks  . Drug use: No  . Sexual activity: Not on file  Lifestyle  . Physical activity    Days per week: Not on file    Minutes per session: Not on file  . Stress: Not on file  Relationships  . Social Herbalist on phone: Not on file    Gets together: Not on file    Attends religious service: Not on file     Active member of club or organization: Not on file    Attends meetings of clubs or organizations: Not on file    Relationship status: Not on file  . Intimate partner violence    Fear of current or ex partner: Not on file    Emotionally abused: Not on file    Physically abused: Not on file    Forced sexual activity: Not on file  Other Topics Concern  . Not on file  Social History Narrative  . Not on file   Family History  Problem Relation Age of Onset  . Stroke Father   . Stroke Mother   . Diabetes Mother   . CAD Sister   . Diabetes Sister   . CAD Sister   . Diabetes Sister       VITAL SIGNS BP (!) 140/91   Pulse 74   Temp (!) 97.2 F (36.2 C) (Oral)   Resp 20   Ht 5\' 1"  (1.549 m)   Wt 154 lb 6.4 oz (70 kg)   SpO2 94%   BMI 29.17 kg/m   Outpatient Encounter Medications as of 03/29/2019  Medication Sig  . aspirin  EC 81 MG tablet Take 81 mg by mouth daily.  . calcium carbonate (TUMS - DOSED IN MG ELEMENTAL CALCIUM) 500 MG chewable tablet Chew 1 tablet by mouth daily as needed for indigestion.   . fluticasone (FLONASE) 50 MCG/ACT nasal spray Place 2 sprays into both nostrils daily as needed for allergies or rhinitis.   . Glucerna (GLUCERNA) LIQD Take 237 mLs by mouth 2 (two) times daily between meals.  . metoprolol (TOPROL-XL) 50 MG 24 hr tablet Take 50 mg by mouth daily.    . mirtazapine (REMERON SOL-TAB) 30 MG disintegrating tablet Take 1 tablet by mouth daily.  . nitroGLYCERIN (NITROSTAT) 0.4 MG SL tablet Place 1 tablet (0.4 mg total) under the tongue every 5 (five) minutes as needed. Reported on 10/15/2015  . NON FORMULARY Diet: Regular, NAS, Consistent Carbohydrate  . risperiDONE (RISPERDAL) 1 MG tablet Take 1 mg by mouth at bedtime.  . simvastatin (ZOCOR) 20 MG tablet Take 20 mg by mouth daily.  . traZODone (DESYREL) 50 MG tablet Take 1 tablet by mouth at bedtime.   No facility-administered encounter medications on file as of 03/29/2019.      SIGNIFICANT  DIAGNOSTIC EXAMS   PREVIOUS;   03-10-19: right knee x-ray:  1. No acute osseous abnormality. Trace knee effusion. 2. Small mineralization in the posteromedial knee may reflect vascular calcification or remote posttraumatic mineralization.  03-10-19: ct of head: Advanced atrophy and chronic microvascular ischemia without acute intracranial abnormality  03-12-19: MRI of brain:  1. Motion degraded, incomplete examination. 2. No acute infarct. 3. 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 4. Moderate chronic small vessel ischemic disease.  NO NEW EXAMS.   LABS REVIEWED PREVIOUS;   03-10-19: wbc 9.7; hgb 12.7; hct 40.6; mcv 86.4 plt 296; glucose 123; bun 17; creat 1.05; k+ 4.3;na++ 142; ca 10.1; 03-11-19: wbc 81; hgb 11.8; hct 37.9; mcv 86.1; plt 255; glucose 105; bun 15; creat 0.87; k+ 3.4; an++ 142; ca 9.4; liver normal albumin 4.0 03-12-19: wbc 7.2; hgb 12.1; hct 37.5; mcv 86.2; plt 239; glucose 95; bun 10; creat 0.92; k+ 4.3; na++ 143; ca 9.6  03-20-19: wbc 8.6; hgb 13.0; hct 42.1; mcv 87.2 plt 335; glucose 128; bun 19; creat 1.00; k+ 4.0; na++ 140; ca 9.8 mag 2.3  NO NEW LABS.     Review of Systems  Unable to perform ROS: Dementia (unable to participate )   Physical Exam Constitutional:      General: She is not in acute distress.    Appearance: She is well-developed. She is not diaphoretic.  Neck:     Musculoskeletal: Neck supple.     Thyroid: No thyromegaly.  Cardiovascular:     Rate and Rhythm: Normal rate and regular rhythm.     Pulses: Normal pulses.     Heart sounds: Normal heart sounds.     Comments: History of cardiac stent  Pulmonary:     Effort: Pulmonary effort is normal. No respiratory distress.     Breath sounds: Normal breath sounds.  Abdominal:     General: Bowel sounds are normal. There is no distension.     Palpations: Abdomen is soft.     Tenderness: There is no abdominal tenderness.   Musculoskeletal:     Right lower leg: No edema.     Left lower leg: No edema.     Comments: Is able to move all extremities   Lymphadenopathy:     Cervical: No cervical adenopathy.  Skin:  General: Skin is warm and dry.  Neurological:     Mental Status: She is alert. Mental status is at baseline.  Psychiatric:        Mood and Affect: Mood normal.      ASSESSMENT/ PLAN:  TODAY  1. Vascular dementia without behavioral disturbance 2. Ataxia 3. major depression recurrent chronic   Will continue current medications Will continue current plan of care Will continue therapy as directed Will continue to monitor her status.     MD is aware of resident's narcotic use and is in agreement with current plan of care. We will attempt to wean resident as appropriate.  Synthia Innocent NP [facility]  Contact [phone] Monday through Friday 8am- 5pm  After hours call [phone]

## 2019-04-03 ENCOUNTER — Non-Acute Institutional Stay (SKILLED_NURSING_FACILITY): Payer: Medicare HMO | Admitting: Adult Health

## 2019-04-03 ENCOUNTER — Other Ambulatory Visit: Payer: Self-pay | Admitting: Adult Health

## 2019-04-03 ENCOUNTER — Encounter: Payer: Self-pay | Admitting: Adult Health

## 2019-04-03 DIAGNOSIS — F0150 Vascular dementia without behavioral disturbance: Secondary | ICD-10-CM | POA: Diagnosis not present

## 2019-04-03 DIAGNOSIS — R270 Ataxia, unspecified: Secondary | ICD-10-CM

## 2019-04-03 DIAGNOSIS — I10 Essential (primary) hypertension: Secondary | ICD-10-CM | POA: Diagnosis not present

## 2019-04-03 DIAGNOSIS — G9389 Other specified disorders of brain: Secondary | ICD-10-CM | POA: Diagnosis not present

## 2019-04-03 DIAGNOSIS — R69 Illness, unspecified: Secondary | ICD-10-CM | POA: Diagnosis not present

## 2019-04-03 MED ORDER — NITROGLYCERIN 0.4 MG SL SUBL: 0.4000 mg | SUBLINGUAL_TABLET | SUBLINGUAL | 0 refills | Status: AC | PRN

## 2019-04-03 MED ORDER — SIMVASTATIN 20 MG PO TABS: 20.0000 mg | ORAL_TABLET | Freq: Every day | ORAL | 0 refills | Status: AC

## 2019-04-03 MED ORDER — METOPROLOL SUCCINATE ER 50 MG PO TB24: 50.0000 mg | ORAL_TABLET | Freq: Every day | ORAL | 0 refills | Status: AC

## 2019-04-03 MED ORDER — TRAZODONE HCL 50 MG PO TABS: 50.0000 mg | ORAL_TABLET | Freq: Every day | ORAL | 0 refills | Status: AC

## 2019-04-03 MED ORDER — RISPERIDONE 1 MG PO TABS: 1.0000 mg | ORAL_TABLET | Freq: Every day | ORAL | 0 refills | Status: AC

## 2019-04-03 MED ORDER — MIRTAZAPINE 30 MG PO TBDP: 30.0000 mg | ORAL_TABLET | Freq: Every day | ORAL | 0 refills | Status: AC

## 2019-04-03 NOTE — Progress Notes (Addendum)
Location:    [facility] Room Number: 151/P Place of Service:  SNF (31)    CODE STATUS: full code    No Known Allergies  Chief Complaint  Patient presents with  . Discharge Note    Discharge Visit    HPI:  She is being discharged to home with home health for pt/ot. She will need a wheelchair. She will need her prescriptions written and will need to follow up with her medical provider. She had been hospitalized for falls and ataxia. She was admitted to this facility for short tern rehab. She has participate in ot/ot. Her cognition is poor and will require her family to provide 24 hour care. She is ready to complete her therapy on a home health.     Past Medical History:  Diagnosis Date  . Chest pain   . Dementia ([place])   . Dyslipidemia   . GERD (gastroesophageal reflux disease)   . Sinus tachycardia     Past Surgical History:  Procedure Laterality Date  . CORONARY ANGIOPLASTY WITH STENT PLACEMENT      Social History   Socioeconomic History  . Marital status: Married    Spouse name: Not on file  . Number of children: Not on file  . Years of education: Not on file  . Highest education level: Not on file  Occupational History  . Not on file  Social Needs  . Financial resource strain: Not on file  . Food insecurity    Worry: Not on file    Inability: Not on file  . Transportation needs    Medical: Not on file    Non-medical: Not on file  Tobacco Use  . Smoking status: Never Smoker  . Smokeless tobacco: Never Used  Substance and Sexual Activity  . Alcohol use: No    Alcohol/week: 0.0 standard drinks  . Drug use: No  . Sexual activity: Not on file  Lifestyle  . Physical activity    Days per week: Not on file    Minutes per session: Not on file  . Stress: Not on file  Relationships  . Social Herbalist on phone: Not on file    Gets together: Not on file    Attends religious service: Not on file    Active member of club or  organization: Not on file    Attends meetings of clubs or organizations: Not on file    Relationship status: Not on file  . Intimate partner violence    Fear of current or ex partner: Not on file    Emotionally abused: Not on file    Physically abused: Not on file    Forced sexual activity: Not on file  Other Topics Concern  . Not on file  Social History Narrative  . Not on file   Family History  Problem Relation Age of Onset  . Stroke Father   . Stroke Mother   . Diabetes Mother   . CAD Sister   . Diabetes Sister   . CAD Sister   . Diabetes Sister     VITAL SIGNS BP 134/83   Pulse 84   Temp 98.1 F (36.7 C) (Oral)   Resp 20   Ht 5\' 1"  (1.549 m)   Wt 153 lb 12.8 oz (69.8 kg)   SpO2 94%   BMI 29.06 kg/m   Patient's Medications  New Prescriptions   No medications on file  Previous Medications   ASPIRIN EC  81 MG TABLET    Take 81 mg by mouth daily.   CALCIUM CARBONATE (TUMS - DOSED IN MG ELEMENTAL CALCIUM) 500 MG CHEWABLE TABLET    Chew 1 tablet by mouth daily as needed for indigestion.    FLUTICASONE (FLONASE) 50 MCG/ACT NASAL SPRAY    Place 2 sprays into both nostrils daily as needed for allergies or rhinitis.    GLUCERNA (GLUCERNA) LIQD    Take 237 mLs by mouth 2 (two) times daily between meals.   METOPROLOL SUCCINATE (TOPROL-XL) 50 MG 24 HR TABLET    Take 1 tablet (50 mg total) by mouth daily.   MIRTAZAPINE (REMERON SOL-TAB) 30 MG DISINTEGRATING TABLET    Take 1 tablet (30 mg total) by mouth daily.   NITROGLYCERIN (NITROSTAT) 0.4 MG SL TABLET    Place 1 tablet (0.4 mg total) under the tongue every 5 (five) minutes as needed. Reported on 10/15/2015   NON FORMULARY    Diet: Regular, NAS, Consistent Carbohydrate   RISPERIDONE (RISPERDAL) 1 MG TABLET    Take 1 tablet (1 mg total) by mouth at bedtime.   SIMVASTATIN (ZOCOR) 20 MG TABLET    Take 1 tablet (20 mg total) by mouth daily.   TRAZODONE (DESYREL) 50 MG TABLET    Take 1 tablet (50 mg total) by mouth at bedtime.   Modified Medications   No medications on file  Discontinued Medications   No medications on file     SIGNIFICANT DIAGNOSTIC EXAMS   PREVIOUS;   03-10-19: right knee x-ray:  1. No acute osseous abnormality. Trace knee effusion. 2. Small mineralization in the posteromedial knee may reflect vascular calcification or remote posttraumatic mineralization.  03-10-19: ct of head: Advanced atrophy and chronic microvascular ischemia without acute intracranial abnormality  03-12-19: MRI of brain:  1. Motion degraded, incomplete examination. 2. No acute infarct. 3. 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 4. Moderate chronic small vessel ischemic disease.  NO NEW EXAMS.   LABS REVIEWED PREVIOUS;   03-10-19: wbc 9.7; hgb 12.7; hct 40.6; mcv 86.4 plt 296; glucose 123; bun 17; creat 1.05; k+ 4.3;na++ 142; ca 10.1; 03-11-19: wbc 81; hgb 11.8; hct 37.9; mcv 86.1; plt 255; glucose 105; bun 15; creat 0.87; k+ 3.4; an++ 142; ca 9.4; liver normal albumin 4.0 03-12-19: wbc 7.2; hgb 12.1; hct 37.5; mcv 86.2; plt 239; glucose 95; bun 10; creat 0.92; k+ 4.3; na++ 143; ca 9.6  03-20-19: wbc 8.6; hgb 13.0; hct 42.1; mcv 87.2 plt 335; glucose 128; bun 19; creat 1.00; k+ 4.0; na++ 140; ca 9.8 mag 2.3  NO NEW LABS.     Review of Systems  Unable to perform ROS: Dementia (unable to participate )   Physical Exam Constitutional:      General: She is not in acute distress.    Appearance: She is well-developed. She is not diaphoretic.  Neck:     Musculoskeletal: Neck supple.     Thyroid: No thyromegaly.  Cardiovascular:     Rate and Rhythm: Normal rate and regular rhythm.     Pulses: Normal pulses.     Heart sounds: Normal heart sounds.     Comments: History of cardiac stent Pulmonary:     Effort: Pulmonary effort is normal. No respiratory distress.     Breath sounds: Normal breath sounds.  Abdominal:     General: Bowel sounds are  normal. There is no distension.     Palpations: Abdomen is soft.  Tenderness: There is no abdominal tenderness.  Musculoskeletal: Normal range of motion.     Right lower leg: No edema.     Left lower leg: No edema.  Lymphadenopathy:     Cervical: No cervical adenopathy.  Skin:    General: Skin is warm and dry.  Neurological:     Mental Status: She is alert. Mental status is at baseline.  Psychiatric:        Mood and Affect: Mood normal.      ASSESSMENT/ PLAN:   Patient is being discharged with the following home health services:  Pt/ot To evaluate and treat as indicated for gait balance strength adl training.   Patient is being discharged with the following durable medical equipment:  Light weight wheelchair with elevated leg rests; cushion; anti-tippers brake extensions to allow her to maintain her current level of independence with her adls which cannot be achieved with a walker; can self propel.   Patient has been advised to f/u with their PCP in 1-2 weeks to bring them up to date on their rehab stay.  Social services at facility was responsible for arranging this appointment.  Pt was provided with a 30 day supply of prescriptions for medications and refills must be obtained from their PCP.  For controlled substances, a more limited supply may be provided adequate until PCP appointment only.  A 30 day supply of her prescription medications have been sent to [facility]   Time spent with patient 40 minutes: medications home health dme.    Synthia Innocenteborah Trampas Stettner NP [facility]  Contact [phone] Monday through Friday 8am- 5pm  After hours call [phone]

## 2019-05-02 DEATH — deceased

## 2019-05-21 IMAGING — DX DG CHEST 2V
2 series · 2 of 2 positions shown · non-contrast
Comparison: 09/03/2008

CLINICAL DATA: Fall, weakness, chest pain

EXAM:
CHEST - 2 VIEW

[chest ap]
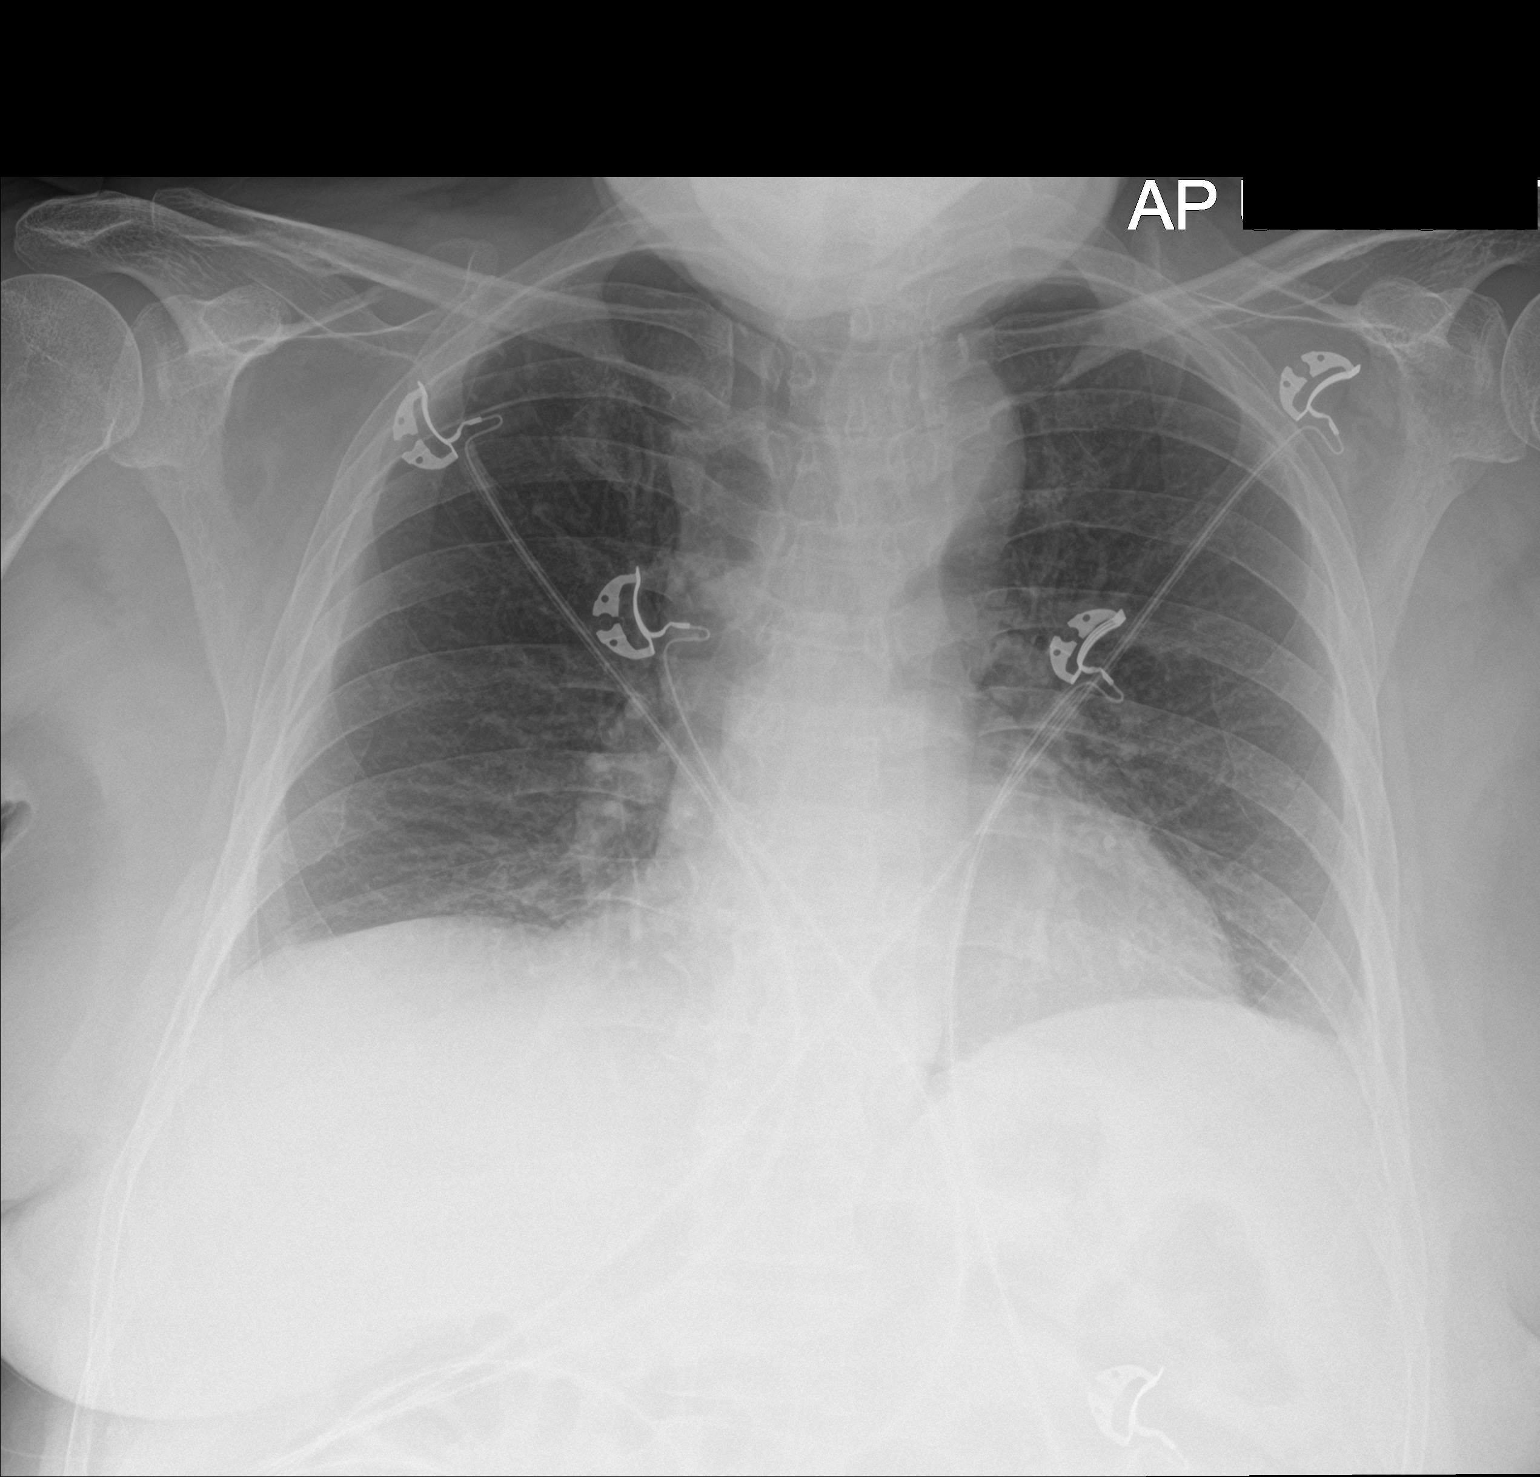

[chest lat]
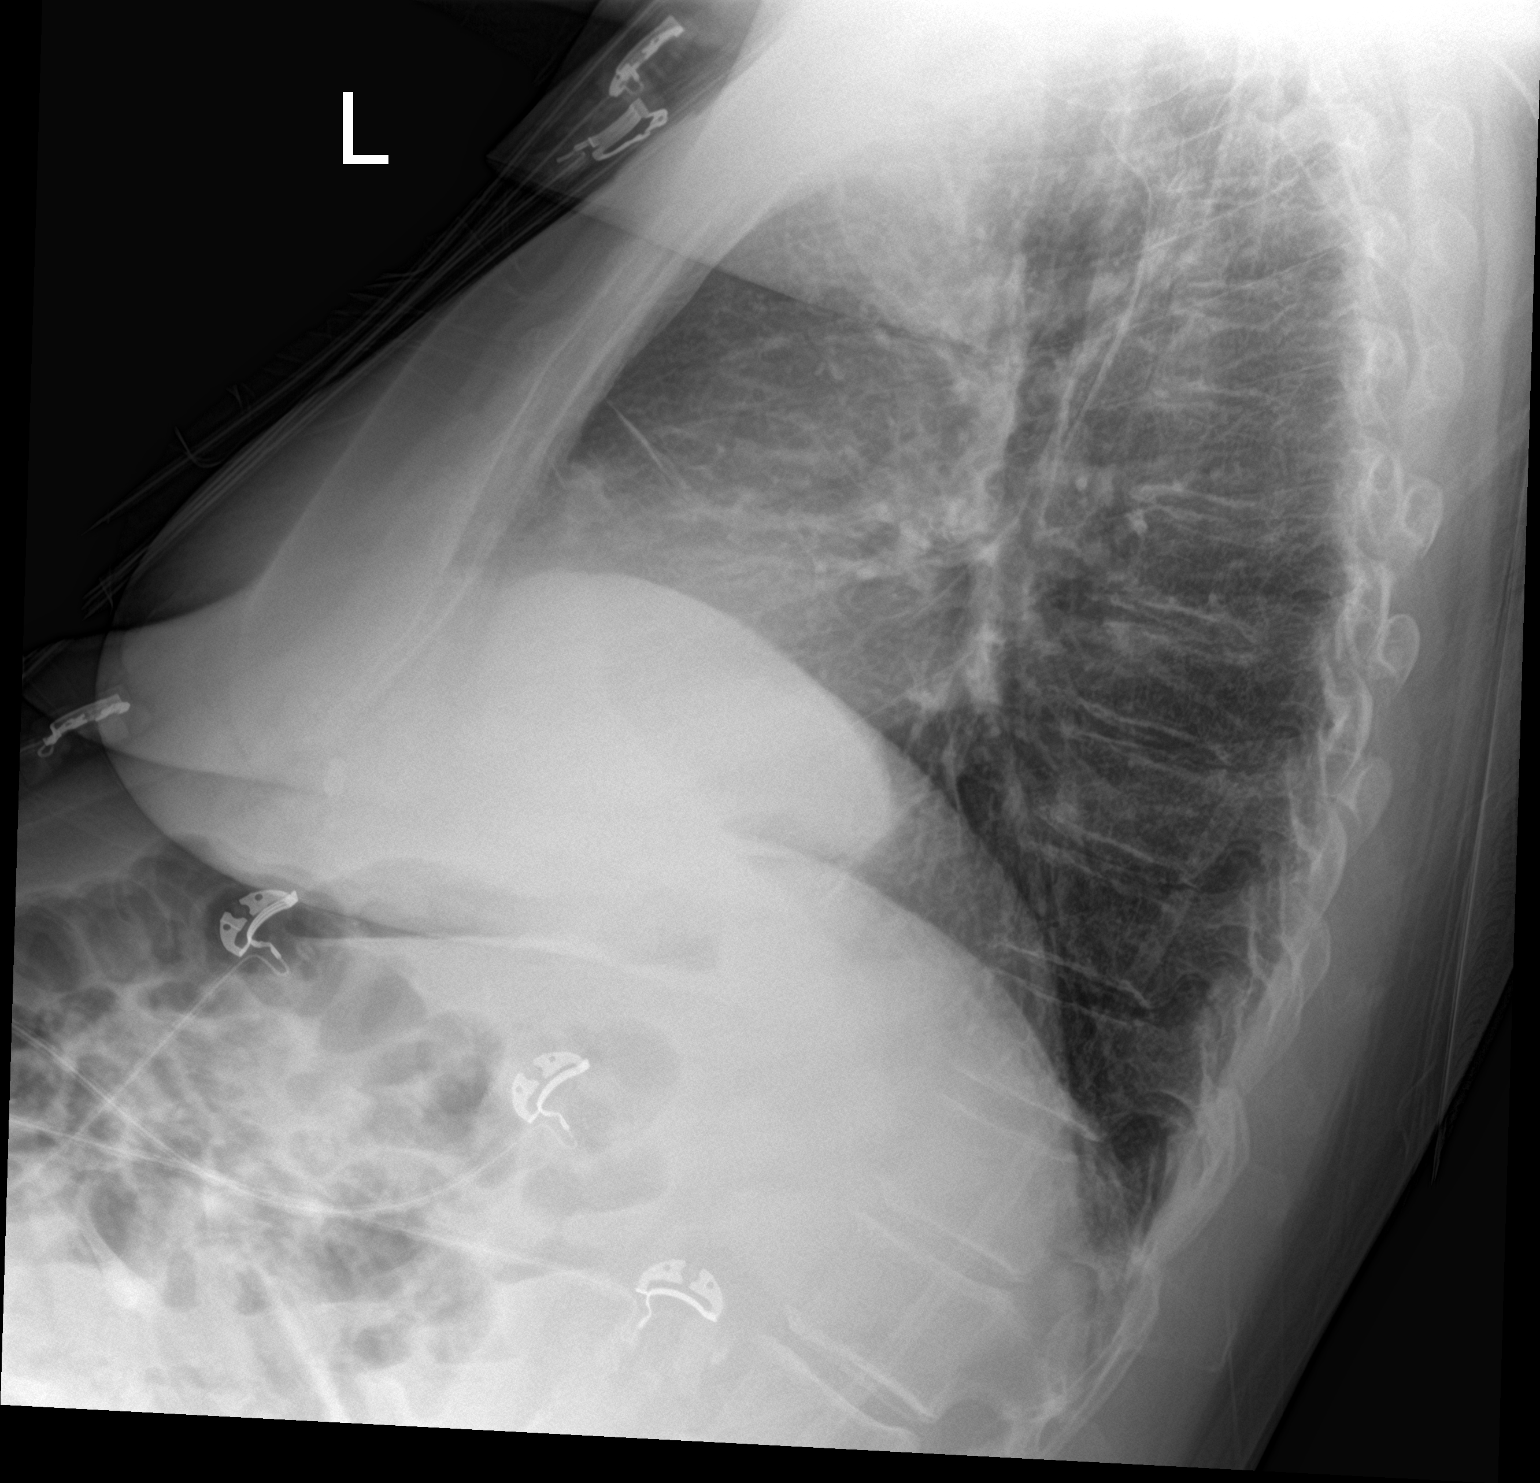

[2 of 2 positions shown; findings below may reference images not displayed]

FINDINGS: Mild left basilar scarring/atelectasis. Mild right middle lobe
opacity, best visualized overlying the heart on the lateral view. No
pleural effusion or pneumothorax.

The heart is normal in size.

Degenerative changes of the visualized thoracolumbar spine.
IMPRESSION: Right middle lobe opacity, suspicious for pneumonia.

## 2020-11-02 IMAGING — MR MR HEAD W/O CM
5 series · 48 of 48 positions shown · non-contrast
Comparison: Head CT 03/11/2019

CLINICAL DATA: Ataxia.  Fall.

EXAM:
MRI HEAD WITHOUT CONTRAST
TECHNIQUE: Multiplanar, multiecho pulse sequences of the brain and surrounding
structures were obtained without intravenous contrast.

[Series 2: DWI · axial · 3.0mm · 0.94mm/px · z∈[-180,-32]mm · 17 of 104 slices shown (1 of 2)]
[im 1/104]
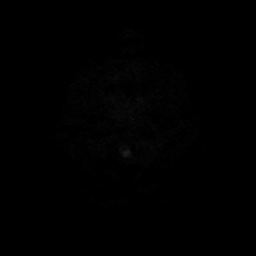
[im 7/104]
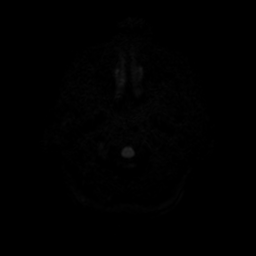
[im 13/104]
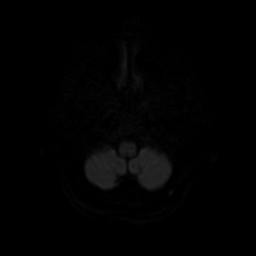
[im 20/104]
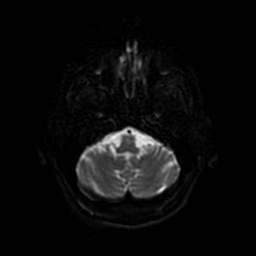
[im 26/104]
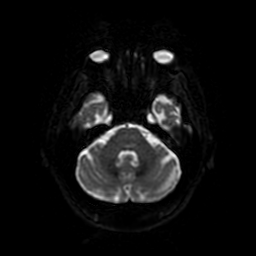
[im 33/104]
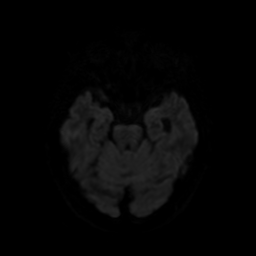
[im 39/104]
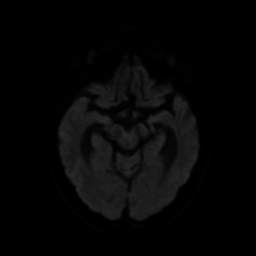
[im 46/104]
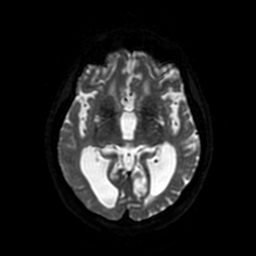
[im 52/104]
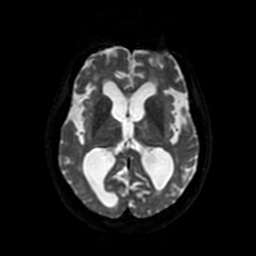
[im 58/104]
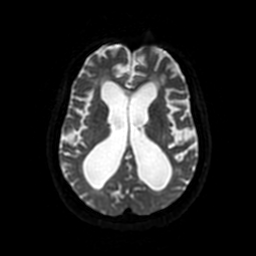
[im 65/104]
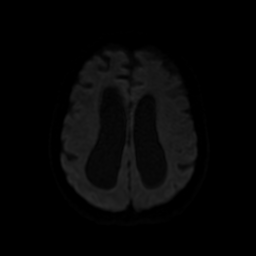
[im 71/104]
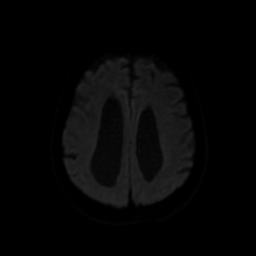
[im 78/104]
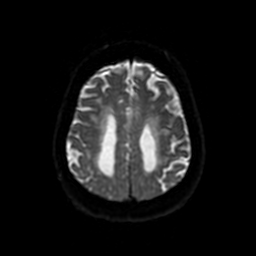
[im 84/104]
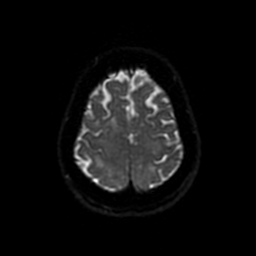
[im 91/104]
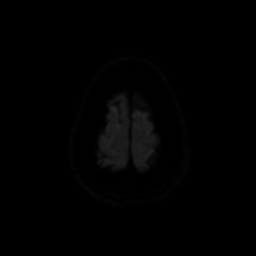
[im 97/104]
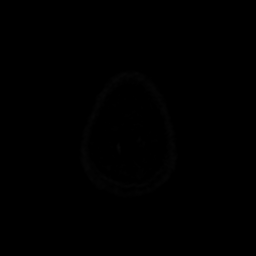
[im 104/104]
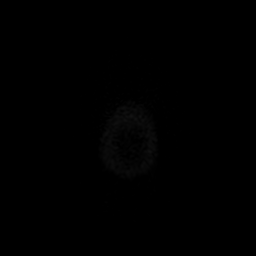

[Series 3: DWI · coronal · 4.0mm · 0.94mm/px · 12 of 72 slices shown (2 of 2)]
[im 1/72]
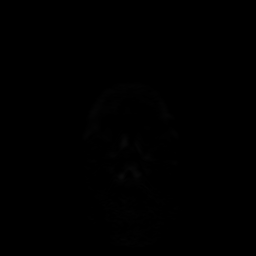
[im 7/72]
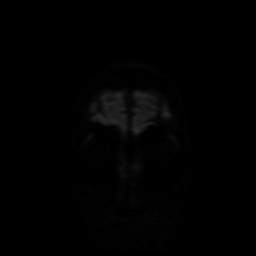
[im 13/72]
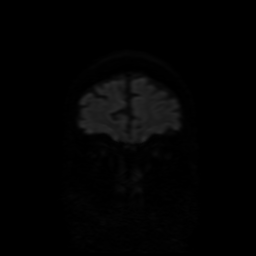
[im 20/72]
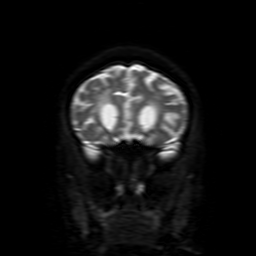
[im 26/72]
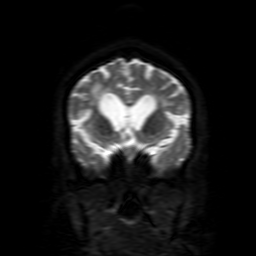
[im 33/72]
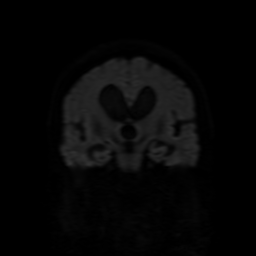
[im 39/72]
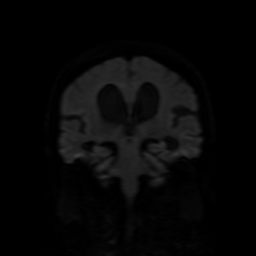
[im 46/72]
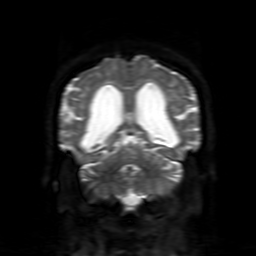
[im 52/72]
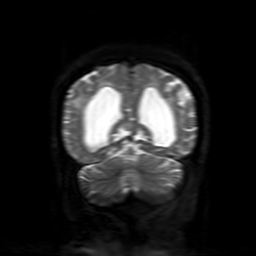
[im 59/72]
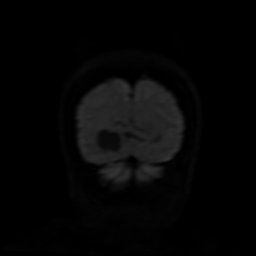
[im 65/72]
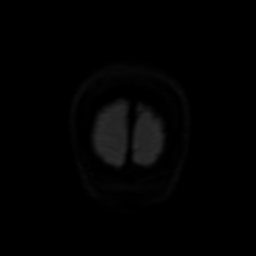
[im 72/72]
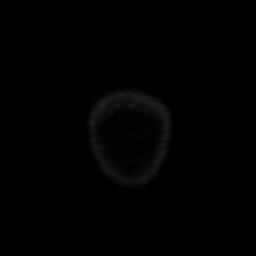

[Series 4: FLAIR · axial · 3.0mm · 0.45mm/px · z∈[-177,-32]mm · 4 of 26 slices shown]
[im 1/26]
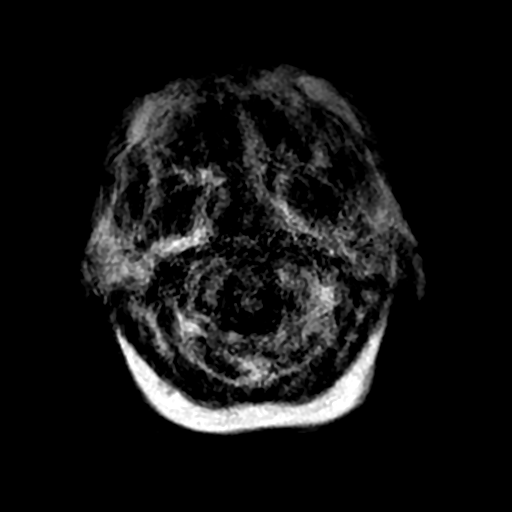
[im 9/26]
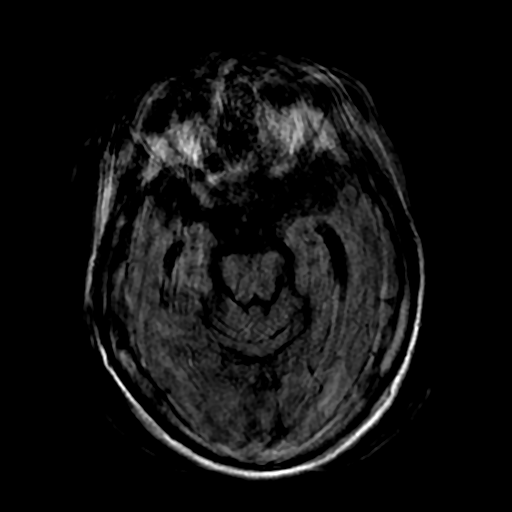
[im 17/26]
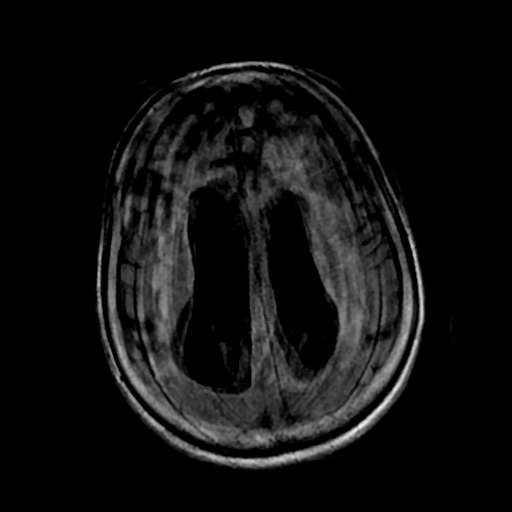
[im 26/26]
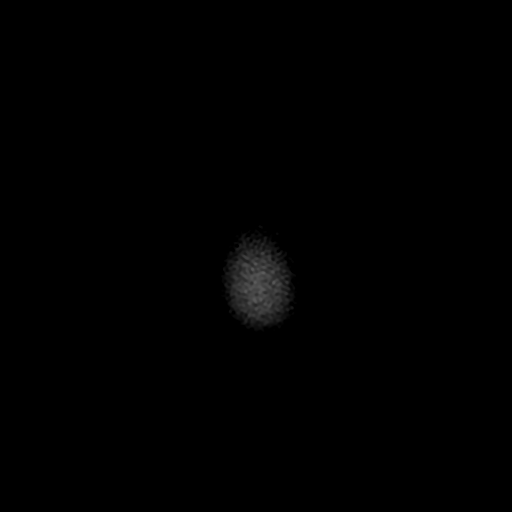

[Series 250: ADC · axial · 3.0mm · 0.94mm/px · z∈[-180,-32]mm · 9 of 52 slices shown (1 of 2)]
[im 1/52]
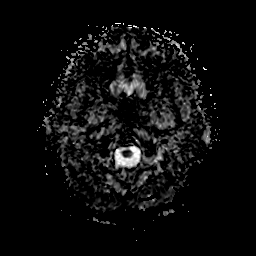
[im 7/52]
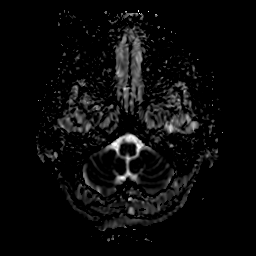
[im 13/52]
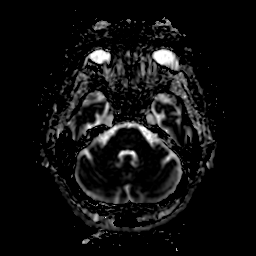
[im 20/52]
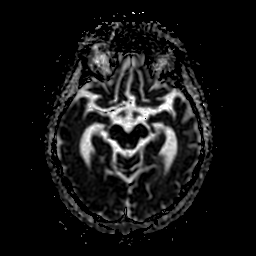
[im 26/52]
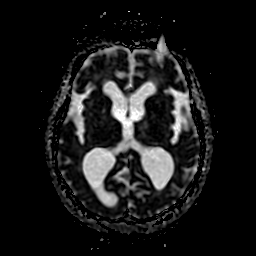
[im 32/52]
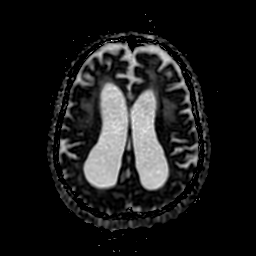
[im 39/52]
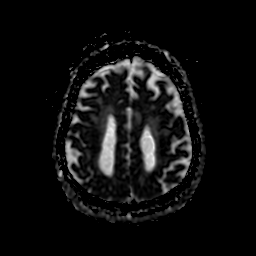
[im 45/52]
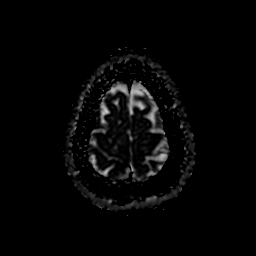
[im 52/52]
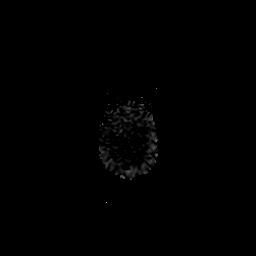

[Series 350: ADC · coronal · 4.0mm · 0.94mm/px · 6 of 36 slices shown (2 of 2)]
[im 1/36]
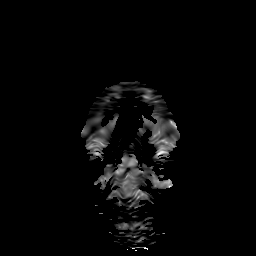
[im 8/36]
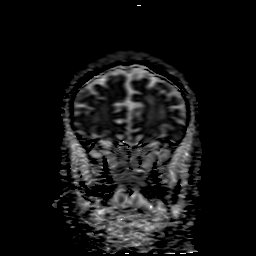
[im 15/36]
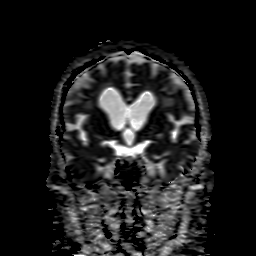
[im 22/36]
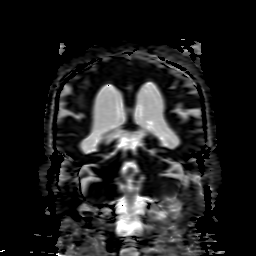
[im 29/36]
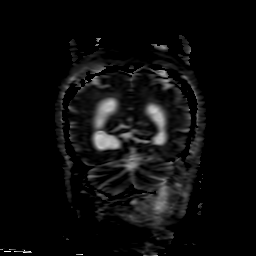
[im 36/36]
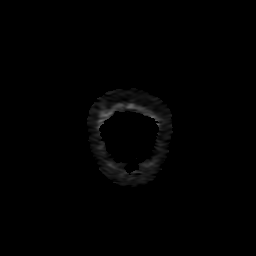

[48 of 48 positions shown; findings below may reference images not displayed]

FINDINGS: The patient was unable to tolerate the examination which was
terminated prior to completion. Axial and coronal diffusion and
axial FLAIR sequences were obtained with the latter being moderately
to severely motion degraded.

Diffusion imaging is of diagnostic quality, and no acute infarct is
identified. There is prominent chronic lateral and third
ventriculomegaly with sulcal crowding at the vertex. Patchy to
confluent T2 hyperintensities in the cerebral white matter
bilaterally are nonspecific but compatible with moderate chronic
small vessel ischemic disease, no gross intracranial hemorrhage,
intracranial mass effect, or extra-axial fluid collection is
identified.
IMPRESSION: 1. Motion degraded, incomplete examination.
2. No acute infarct.
3. Chronic ventriculomegaly which may reflect normal pressure
hydrocephalus in the appropriate clinical setting.
4. Moderate chronic small vessel ischemic disease.
